# Patient Record
Sex: Female | Born: 1995
Health system: Southern US, Community
[De-identification: ages and names within clinical notes are randomized; demographics above are authoritative.]

## PROBLEM LIST (undated history)

## (undated) DIAGNOSIS — F419 Anxiety disorder, unspecified: Secondary | ICD-10-CM

## (undated) DIAGNOSIS — K589 Irritable bowel syndrome without diarrhea: Secondary | ICD-10-CM

## (undated) DIAGNOSIS — M314 Aortic arch syndrome [Takayasu]: Secondary | ICD-10-CM

## (undated) HISTORY — DX: Aortic arch syndrome (takayasu): M31.4

## (undated) HISTORY — DX: Anxiety disorder, unspecified: F41.9

---
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
## 2017-02-02 DIAGNOSIS — M314 Aortic arch syndrome [Takayasu]: Secondary | ICD-10-CM | POA: Diagnosis not present

## 2017-04-21 DIAGNOSIS — M314 Aortic arch syndrome [Takayasu]: Secondary | ICD-10-CM | POA: Diagnosis not present

## 2017-04-21 DIAGNOSIS — K5900 Constipation, unspecified: Secondary | ICD-10-CM | POA: Diagnosis not present

## 2017-04-21 DIAGNOSIS — M545 Low back pain: Secondary | ICD-10-CM | POA: Diagnosis not present

## 2017-04-21 DIAGNOSIS — R109 Unspecified abdominal pain: Secondary | ICD-10-CM | POA: Diagnosis not present

## 2017-04-21 DIAGNOSIS — M549 Dorsalgia, unspecified: Secondary | ICD-10-CM | POA: Diagnosis not present

## 2017-04-21 DIAGNOSIS — Z0389 Encounter for observation for other suspected diseases and conditions ruled out: Secondary | ICD-10-CM | POA: Diagnosis not present

## 2017-06-10 DIAGNOSIS — Z3041 Encounter for surveillance of contraceptive pills: Secondary | ICD-10-CM | POA: Diagnosis not present

## 2017-07-28 DIAGNOSIS — S5292XA Unspecified fracture of left forearm, initial encounter for closed fracture: Secondary | ICD-10-CM | POA: Diagnosis not present

## 2017-07-30 DIAGNOSIS — S52552A Other extraarticular fracture of lower end of left radius, initial encounter for closed fracture: Secondary | ICD-10-CM | POA: Diagnosis not present

## 2017-07-30 DIAGNOSIS — S63502A Unspecified sprain of left wrist, initial encounter: Secondary | ICD-10-CM | POA: Diagnosis not present

## 2017-07-30 DIAGNOSIS — M25532 Pain in left wrist: Secondary | ICD-10-CM | POA: Diagnosis not present

## 2017-07-30 DIAGNOSIS — S52532A Colles' fracture of left radius, initial encounter for closed fracture: Secondary | ICD-10-CM | POA: Diagnosis not present

## 2017-08-27 DIAGNOSIS — M25532 Pain in left wrist: Secondary | ICD-10-CM | POA: Diagnosis not present

## 2017-09-29 ENCOUNTER — Encounter (HOSPITAL_COMMUNITY): Payer: Self-pay | Admitting: Emergency Medicine

## 2017-09-29 ENCOUNTER — Emergency Department (HOSPITAL_COMMUNITY)
Admission: EM | Admit: 2017-09-29 | Discharge: 2017-09-29 | Disposition: A | Discharge status: Home / Self Care | Payer: BLUE CROSS/BLUE SHIELD | Attending: Emergency Medicine | Admitting: Emergency Medicine

## 2017-09-29 ENCOUNTER — Emergency Department (HOSPITAL_COMMUNITY): Payer: BLUE CROSS/BLUE SHIELD

## 2017-09-29 DIAGNOSIS — M62830 Muscle spasm of back: Secondary | ICD-10-CM | POA: Insufficient documentation

## 2017-09-29 DIAGNOSIS — R109 Unspecified abdominal pain: Secondary | ICD-10-CM | POA: Diagnosis not present

## 2017-09-29 DIAGNOSIS — M549 Dorsalgia, unspecified: Secondary | ICD-10-CM | POA: Diagnosis not present

## 2017-09-29 DIAGNOSIS — R1031 Right lower quadrant pain: Secondary | ICD-10-CM | POA: Diagnosis present

## 2017-09-29 LAB — COMPREHENSIVE METABOLIC PANEL
ALK PHOS: 49 U/L (ref 38–126)
ALT: 20 U/L (ref 14–54)
ANION GAP: 10 (ref 5–15)
AST: 24 U/L (ref 15–41)
Albumin: 4.4 g/dL (ref 3.5–5.0)
BUN: 11 mg/dL (ref 6–20)
CHLORIDE: 108 mmol/L (ref 101–111)
CO2: 22 mmol/L (ref 22–32)
CREATININE: 0.77 mg/dL (ref 0.44–1.00)
Calcium: 9.3 mg/dL (ref 8.9–10.3)
Glucose, Bld: 97 mg/dL (ref 65–99)
POTASSIUM: 3.8 mmol/L (ref 3.5–5.1)
Sodium: 140 mmol/L (ref 135–145)
Total Bilirubin: 0.4 mg/dL (ref 0.3–1.2)
Total Protein: 7.5 g/dL (ref 6.5–8.1)

## 2017-09-29 LAB — CBC WITH DIFFERENTIAL/PLATELET
Basophils Absolute: 0 10*3/uL (ref 0.0–0.1)
Basophils Relative: 0 %
Eosinophils Absolute: 0.1 10*3/uL (ref 0.0–0.7)
Eosinophils Relative: 1 %
HEMATOCRIT: 41.2 % (ref 36.0–46.0)
HEMOGLOBIN: 14 g/dL (ref 12.0–15.0)
LYMPHS ABS: 2.3 10*3/uL (ref 0.7–4.0)
LYMPHS PCT: 29 %
MCH: 30 pg (ref 26.0–34.0)
MCHC: 34 g/dL (ref 30.0–36.0)
MCV: 88.2 fL (ref 78.0–100.0)
MONO ABS: 0.3 10*3/uL (ref 0.1–1.0)
MONOS PCT: 4 %
NEUTROS ABS: 5 10*3/uL (ref 1.7–7.7)
Neutrophils Relative %: 66 %
Platelets: 308 10*3/uL (ref 150–400)
RBC: 4.67 MIL/uL (ref 3.87–5.11)
RDW: 12.6 % (ref 11.5–15.5)
WBC: 7.8 10*3/uL (ref 4.0–10.5)

## 2017-09-29 LAB — POC URINE PREG, ED: Preg Test, Ur: NEGATIVE

## 2017-09-29 LAB — URINALYSIS, ROUTINE W REFLEX MICROSCOPIC
Bilirubin Urine: NEGATIVE
GLUCOSE, UA: NEGATIVE mg/dL
HGB URINE DIPSTICK: NEGATIVE
Ketones, ur: NEGATIVE mg/dL
Leukocytes, UA: NEGATIVE
Nitrite: NEGATIVE
PH: 7 (ref 5.0–8.0)
PROTEIN: NEGATIVE mg/dL
SPECIFIC GRAVITY, URINE: 1.005 (ref 1.005–1.030)

## 2017-09-29 LAB — C-REACTIVE PROTEIN

## 2017-09-29 LAB — SEDIMENTATION RATE: SED RATE: 3 mm/h (ref 0–22)

## 2017-09-29 MED ORDER — CYCLOBENZAPRINE HCL 10 MG PO TABS: 10.0000 mg | ORAL_TABLET | Freq: Two times a day (BID) | ORAL | 0 refills | Status: DC | PRN

## 2017-09-29 MED ORDER — ONDANSETRON 4 MG PO TBDP
4.0000 mg | ORAL_TABLET | Freq: Once | ORAL | Status: AC
  Administered 2017-09-29: 4 mg via ORAL
  Filled 2017-09-29: qty 1

## 2017-09-29 MED ORDER — HYDROMORPHONE HCL 1 MG/ML IJ SOLN
1.0000 mg | Freq: Once | INTRAMUSCULAR | Status: AC
  Administered 2017-09-29: 1 mg via INTRAVENOUS
  Filled 2017-09-29: qty 1

## 2017-09-29 MED ORDER — DICLOFENAC SODIUM 50 MG PO TBEC: 50.0000 mg | DELAYED_RELEASE_TABLET | Freq: Two times a day (BID) | ORAL | 0 refills | Status: DC

## 2017-09-29 MED ORDER — CYCLOBENZAPRINE HCL 10 MG PO TABS
10.0000 mg | ORAL_TABLET | Freq: Once | ORAL | Status: AC
  Administered 2017-09-29: 10 mg via ORAL
  Filled 2017-09-29: qty 1

## 2017-09-29 MED ORDER — PREDNISONE 20 MG PO TABS
60.0000 mg | ORAL_TABLET | Freq: Once | ORAL | Status: AC
  Administered 2017-09-29: 60 mg via ORAL
  Filled 2017-09-29: qty 3

## 2017-09-29 MED ORDER — HYDROCODONE-ACETAMINOPHEN 5-325 MG PO TABS
1.0000 | ORAL_TABLET | Freq: Once | ORAL | Status: AC
  Administered 2017-09-29: 1 via ORAL
  Filled 2017-09-29: qty 1

## 2017-09-29 MED ORDER — IOPAMIDOL (ISOVUE-370) INJECTION 76%
INTRAVENOUS | Status: AC
  Administered 2017-09-29: 80 mL
  Filled 2017-09-29: qty 100

## 2017-09-29 MED ORDER — SODIUM CHLORIDE 0.9 % IV BOLUS (SEPSIS)
1000.0000 mL | Freq: Once | INTRAVENOUS | Status: AC
  Administered 2017-09-29: 1000 mL via INTRAVENOUS

## 2017-09-29 NOTE — ED Notes (Signed)
Patient transported to CT 

## 2017-09-29 NOTE — ED Notes (Signed)
Patient states no relief from meds-patient texting on phone

## 2017-09-29 NOTE — ED Provider Notes (Signed)
[facility]   CSN: [id number] Arrival date & time: 09/29/17  1621     History   Chief Complaint Chief Complaint  Patient presents with  . Back Pain    HPI Kelsey Myers is a 21 y.o. female who presents to the ED with back pain. The pain is located on the lower right side and radiates to the right side of abdomen. Patient reports the pain started 5 days ago and has gotten worse. She reports that she is in remission for  Takayasu's and has episodes of back pain that doctors think may be related. The pain reports having similar back pain to what it is today and that she usually gets an IV and is given medication through the IV.   The history is provided by the patient. No language interpreter was used.  Back Pain   The current episode started more than 2 days ago. The problem occurs constantly. The problem has been gradually worsening. The pain is associated with no known injury. The pain is present in the lumbar spine. The pain is at a severity of 8/10. The symptoms are aggravated by bending and twisting. The pain is the same all the time. Associated symptoms include a fever (yesterday 100), headaches and abdominal pain. Pertinent negatives include no chest pain, no bowel incontinence, no bladder incontinence, no dysuria and no pelvic pain. She has tried NSAIDs for the symptoms. The treatment provided no relief.    Past Medical History:  Diagnosis Date  . Takayasu's arteritis (HCC)     There are no active problems to display for this patient.   History reviewed. No pertinent surgical history.  OB History    No data available       Home Medications    Prior to Admission medications   Medication Sig Start Date End Date Taking? Authorizing Provider  JUNEL FE 1/20 1-20 MG-MCG tablet TK 1 T PO QD. PATIENT NEEDS TO SCHEDULE ANNUAL APPOINTMENT PRIOR TO FURTHER REFILLS. [facility] 08/09/17  Yes [provider]  azithromycin (ZITHROMAX Z-PAK) 250 MG tablet Take as directed on pack Patient not taking: Reported on 09/29/2017 08/29/16   Linna HoffKindl, James D, MD  cyclobenzaprine (FLEXERIL) 10 MG tablet Take 1 tablet (10 mg total) by mouth 2 (two) times daily as needed for muscle spasms. 09/29/17   Janne NapoleonNeese, Eniyah Eastmond M, NP  diclofenac (VOLTAREN) 50 MG EC tablet Take 1 tablet (50 mg total) by mouth 2 (two) times daily. 09/29/17   Janne NapoleonNeese, Gwen Edler M, NP  ipratropium (ATROVENT) 0.06 % nasal spray Place 2 sprays into both nostrils 4 (four) times daily. Patient not taking: Reported on 09/29/2017 08/29/16   Linna HoffKindl, James D, MD    Family History No family history on file.  Social History Social History  Substance Use Topics  . Smoking status: Never Smoker  . Smokeless tobacco: Never Used  . Alcohol use No     Allergies   Patient has no known allergies.   Review of Systems Review of Systems  Constitutional: Positive for fever (yesterday 100). Negative for chills.  HENT: Negative.   Eyes: Negative for visual disturbance.  Respiratory: Negative for chest tightness and shortness of breath.   Cardiovascular: Negative for chest pain.  Gastrointestinal: Positive for abdominal pain and nausea. Negative for bowel incontinence and vomiting.  Genitourinary: Negative for bladder incontinence, difficulty urinating, dysuria, frequency, pelvic pain, vaginal bleeding and vaginal discharge.  Musculoskeletal: Positive for back pain. Negative for  neck pain and neck stiffness.  Skin: Negative for rash and wound.  Neurological: Positive for light-headedness and headaches. Negative for syncope.  Psychiatric/Behavioral: Negative for confusion. The patient is not nervous/anxious.      Physical Exam Updated Vital Signs BP 123/73 (BP Location: Right Arm)   Pulse 65   Temp 98.2 F (36.8 C) (Oral)   Resp 14   LMP 09/15/2017   SpO2 100%   Physical Exam  Constitutional: She is oriented to person, place, and time. She  appears well-developed and well-nourished. No distress.  HENT:  Head: Normocephalic and atraumatic.  Right Ear: Tympanic membrane normal.  Left Ear: Tympanic membrane normal.  Nose: Nose normal.  Mouth/Throat: Uvula is midline, oropharynx is clear and moist and mucous membranes are normal.  Eyes: EOM are normal.  Neck: Normal range of motion. Neck supple.  Cardiovascular: Normal rate and regular rhythm.   Pulmonary/Chest: Effort normal. She has no wheezes. She has no rales.  Abdominal: Soft. Bowel sounds are normal. There is no tenderness.  Musculoskeletal:       Lumbar back: She exhibits tenderness, pain and spasm. She exhibits normal pulse.  Radial pulses 2+, grips equal  Neurological: She is alert and oriented to person, place, and time. She has normal strength. Abnormal gait: due to pain, no foot drag.  Reflex Scores:      Bicep reflexes are 2+ on the right side and 2+ on the left side.      Brachioradialis reflexes are 2+ on the right side and 2+ on the left side.      Patellar reflexes are 2+ on the right side and 2+ on the left side. Skin: Skin is warm and dry.  Psychiatric: She has a normal mood and affect. Her behavior is normal.  Nursing note and vitals reviewed.    ED Treatments / Results  Labs (all labs ordered are listed, but only abnormal results are displayed) Labs Reviewed  URINALYSIS, ROUTINE W REFLEX MICROSCOPIC - Abnormal; Notable for the following:       Result Value   Color, Urine STRAW (*)    All other components within normal limits  CBC WITH DIFFERENTIAL/PLATELET  COMPREHENSIVE METABOLIC PANEL  SEDIMENTATION RATE  C-REACTIVE PROTEIN  POC URINE PREG, ED    Radiology Ct Angio Chest/abd/pel For Dissection W And/or Wo Contrast  Result Date: 09/29/2017 CLINICAL DATA:  Right flank and back pain. History of Takayasu arteritis. EXAM: CT ANGIOGRAPHY CHEST, ABDOMEN AND PELVIS TECHNIQUE: Multidetector CT imaging through the chest, abdomen and pelvis was  performed using the standard protocol during bolus administration of intravenous contrast. Multiplanar reconstructed images and MIPs were obtained and reviewed to evaluate the vascular anatomy. The patient movement during bolus timing leading to suboptimal contrast bolus timing. CONTRAST:  80 cc Isovue 370 IV COMPARISON:  No prior exams available for comparison. Report from chest MRI 03/01/2014 at an outside institution reviewed FINDINGS: CTA CHEST FINDINGS Cardiovascular: No aortic aneurysm, stenosis or hematoma. Majority of IV contrast is within the pulmonary arteries, however exam is diagnostic to rule out aortic dissection. There is faint aortic wall thickening of the descending aorta. Normal heart size. No pulmonary embolus. Minimal pericardial fluid is physiologic. Mediastinum/Nodes: Minimal soft tissue density in the anterior mediastinum consistent with residual thymus, normal for age. No adenopathy. The esophagus is decompressed. Visualized thyroid gland is normal. Lungs/Pleura: Clear lungs. No consolidation, pleural fluid or pulmonary edema. Musculoskeletal: Mild broad-based scoliotic curvature of the lower thoracic and lumbar spine. There are no acute  or suspicious osseous abnormalities. Review of the MIP images confirms the above findings. CTA ABDOMEN AND PELVIS FINDINGS VASCULAR Aorta: Normal caliber aorta without aneurysm, dissection, vasculitis or significant stenosis. No significant luminal regularity. Celiac: Patent without evidence of aneurysm, dissection, vasculitis or significant stenosis. SMA: Patent without evidence of aneurysm, dissection, vasculitis or significant stenosis. Renals: 2 right renal arteries with more superior artery supplying approximately 2/3 and more inferior artery supplying 1/3 of the kidney. Single left renal artery. All renal arteries are patent without evidence of aneurysm, dissection, vasculitis, fibromuscular dysplasia or significant stenosis. IMA: Patent without  evidence of aneurysm, dissection, vasculitis or significant stenosis. Inflow: Patent without evidence of aneurysm, dissection, vasculitis or significant stenosis. Veins: No obvious venous abnormality within the limitations of this arterial phase study. Review of the MIP images confirms the above findings. NON-VASCULAR Hepatobiliary: No focal liver abnormality is seen. No gallstones, gallbladder wall thickening, or biliary dilatation. Pancreas: No ductal dilatation or inflammation. Spleen: Normal arterial phase imaging.  Normal in size. Adrenals/Urinary Tract: Normal adrenal glands. Homogeneous arterial enhancement. No hydronephrosis or perinephric edema. Urinary bladder physiologically distended, no wall thickening. Stomach/Bowel: Stomach is within normal limits. Appendix appears normal. No evidence of bowel wall thickening, distention, or inflammatory changes. Moderate colonic stool burden without colonic inflammation. Tortuous transverse colon. Lymphatic: Scattered prominent ileocolic nodes, largest measuring 8 mm in short axis. No retroperitoneal or pelvic adenopathy. Reproductive: Uterus and bilateral adnexa are unremarkable. Small amount of free fluid in the pelvis is physiologic. Other: No free air, ascites or intra-abdominal abscess. Musculoskeletal: There are no acute or suspicious osseous abnormalities. Review of the MIP images confirms the above findings. IMPRESSION: 1. No acute aortic or vascular abnormality. Trace descending thoracic aortic wall thickening, was described on prior outside chest MRI, likely sequela of prior vasculitis. 2. Scattered prominent ileocolic nodes be reactive or represent mesenteric adenitis. 3. There is otherwise no acute abnormality of the chest, abdomen, or pelvis. Electronically Signed   By: Rubye Oaks M.D.   On: 09/29/2017 22:10    Procedures Procedures (including critical care time)  Medications Ordered in ED Medications  cyclobenzaprine (FLEXERIL) tablet 10  mg (10 mg Oral Given 09/29/17 1734)  HYDROcodone-acetaminophen (NORCO/VICODIN) 5-325 MG per tablet 1 tablet (1 tablet Oral Given 09/29/17 1734)  ondansetron (ZOFRAN-ODT) disintegrating tablet 4 mg (4 mg Oral Given 09/29/17 1734)  predniSONE (DELTASONE) tablet 60 mg (60 mg Oral Given 09/29/17 2001)  sodium chloride 0.9 % bolus 1,000 mL (0 mLs Intravenous Stopped 09/29/17 2144)  HYDROmorphone (DILAUDID) injection 1 mg (1 mg Intravenous Given 09/29/17 2019)  HYDROmorphone (DILAUDID) injection 1 mg (1 mg Intravenous Given 09/29/17 2143)  iopamidol (ISOVUE-370) 76 % injection (80 mLs  Contrast Given 09/29/17 2116)     Initial Impression / Assessment and Plan / ED Course  I have reviewed the triage vital signs and the nursing notes.  Patient with back pain.  No neurological deficits and normal neuro exam.  Patient can walk but states is painful.  No loss of bowel or bladder control.  No concern for cauda equina.  No fever, night sweats, weight loss, h/o cancer, IVDU.  RICE protocol and pain medicine indicated and discussed with patient.  Patient labs and scan reviewed and Dr. Silverio Lay evaluated the patient due to the amount of pain and patient's hx of Takayasu's.  No change in CT from previous done at an outside facility and normal labs.   Final Clinical Impressions(s) / ED Diagnoses   Final diagnoses:  Muscle spasm of  back    New Prescriptions New Prescriptions   CYCLOBENZAPRINE (FLEXERIL) 10 MG TABLET    Take 1 tablet (10 mg total) by mouth 2 (two) times daily as needed for muscle spasms.   DICLOFENAC (VOLTAREN) 50 MG EC TABLET    Take 1 tablet (50 mg total) by mouth 2 (two) times daily.     Kerrie Buffalo [place], [place] 09/29/17 2324    Charlynne Pander, MD 10/01/17 [phone]

## 2017-09-29 NOTE — ED Triage Notes (Signed)
Per pt, states back pain for a couple of weeks-no dysuria-states she has chronic condition and she does not know if that is what is causing her pain

## 2017-09-29 NOTE — Discharge Instructions (Signed)
The medication can make you sleepy so do not drive while taking it. Call your doctor tomorrow for follow up.

## 2017-10-01 ENCOUNTER — Encounter (HOSPITAL_COMMUNITY): Payer: Self-pay | Admitting: Emergency Medicine

## 2017-10-01 ENCOUNTER — Emergency Department (HOSPITAL_COMMUNITY): Payer: BLUE CROSS/BLUE SHIELD

## 2017-10-01 ENCOUNTER — Emergency Department (HOSPITAL_COMMUNITY)
Admission: EM | Admit: 2017-10-01 | Discharge: 2017-10-01 | Disposition: A | Discharge status: Home / Self Care | Payer: BLUE CROSS/BLUE SHIELD | Attending: Emergency Medicine | Admitting: Emergency Medicine

## 2017-10-01 DIAGNOSIS — R5383 Other fatigue: Secondary | ICD-10-CM | POA: Insufficient documentation

## 2017-10-01 DIAGNOSIS — R110 Nausea: Secondary | ICD-10-CM | POA: Diagnosis not present

## 2017-10-01 DIAGNOSIS — R109 Unspecified abdominal pain: Secondary | ICD-10-CM

## 2017-10-01 DIAGNOSIS — Z79899 Other long term (current) drug therapy: Secondary | ICD-10-CM | POA: Insufficient documentation

## 2017-10-01 DIAGNOSIS — R102 Pelvic and perineal pain: Secondary | ICD-10-CM | POA: Diagnosis not present

## 2017-10-01 DIAGNOSIS — Z8679 Personal history of other diseases of the circulatory system: Secondary | ICD-10-CM | POA: Insufficient documentation

## 2017-10-01 DIAGNOSIS — R1011 Right upper quadrant pain: Secondary | ICD-10-CM | POA: Insufficient documentation

## 2017-10-01 LAB — CBC
HEMATOCRIT: 41.3 % (ref 36.0–46.0)
Hemoglobin: 14.3 g/dL (ref 12.0–15.0)
MCH: 30.6 pg (ref 26.0–34.0)
MCHC: 34.6 g/dL (ref 30.0–36.0)
MCV: 88.2 fL (ref 78.0–100.0)
PLATELETS: 330 10*3/uL (ref 150–400)
RBC: 4.68 MIL/uL (ref 3.87–5.11)
RDW: 12.5 % (ref 11.5–15.5)
WBC: 7.1 10*3/uL (ref 4.0–10.5)

## 2017-10-01 LAB — URINALYSIS, ROUTINE W REFLEX MICROSCOPIC
Bilirubin Urine: NEGATIVE
GLUCOSE, UA: NEGATIVE mg/dL
Hgb urine dipstick: NEGATIVE
Ketones, ur: NEGATIVE mg/dL
Nitrite: NEGATIVE
PH: 7 (ref 5.0–8.0)
Protein, ur: NEGATIVE mg/dL
SPECIFIC GRAVITY, URINE: 1.006 (ref 1.005–1.030)

## 2017-10-01 LAB — COMPREHENSIVE METABOLIC PANEL
ALBUMIN: 4.4 g/dL (ref 3.5–5.0)
ALT: 20 U/L (ref 14–54)
AST: 25 U/L (ref 15–41)
Alkaline Phosphatase: 52 U/L (ref 38–126)
Anion gap: 8 (ref 5–15)
BILIRUBIN TOTAL: 0.3 mg/dL (ref 0.3–1.2)
BUN: 10 mg/dL (ref 6–20)
CHLORIDE: 109 mmol/L (ref 101–111)
CO2: 21 mmol/L — ABNORMAL LOW (ref 22–32)
CREATININE: 0.91 mg/dL (ref 0.44–1.00)
Calcium: 9.4 mg/dL (ref 8.9–10.3)
GFR calc Af Amer: >60 mL/min (ref >60)
GLUCOSE: 96 mg/dL (ref 65–99)
POTASSIUM: 3.8 mmol/L (ref 3.5–5.1)
Sodium: 138 mmol/L (ref 135–145)
TOTAL PROTEIN: 7.8 g/dL (ref 6.5–8.1)

## 2017-10-01 LAB — LIPASE, BLOOD: Lipase: 30 U/L (ref 11–51)

## 2017-10-01 LAB — I-STAT BETA HCG BLOOD, ED (MC, WL, AP ONLY): I-stat hCG, quantitative: <5 m[IU]/mL (ref <5)

## 2017-10-01 MED ORDER — ONDANSETRON HCL 4 MG/2ML IJ SOLN
4.0000 mg | Freq: Once | INTRAMUSCULAR | Status: AC
  Administered 2017-10-01: 4 mg via INTRAVENOUS
  Filled 2017-10-01: qty 2

## 2017-10-01 MED ORDER — MORPHINE SULFATE (PF) 4 MG/ML IV SOLN
4.0000 mg | Freq: Once | INTRAVENOUS | Status: AC
  Administered 2017-10-01: 4 mg via INTRAVENOUS
  Filled 2017-10-01: qty 1

## 2017-10-01 MED ORDER — OMEPRAZOLE 20 MG PO CPDR: 20.0000 mg | DELAYED_RELEASE_CAPSULE | Freq: Every day | ORAL | 0 refills | Status: DC

## 2017-10-01 MED ORDER — METOCLOPRAMIDE HCL 5 MG/ML IJ SOLN
10.0000 mg | Freq: Once | INTRAMUSCULAR | Status: AC
  Administered 2017-10-01: 10 mg via INTRAVENOUS
  Filled 2017-10-01: qty 2

## 2017-10-01 MED ORDER — SODIUM CHLORIDE 0.9 % IV BOLUS (SEPSIS)
1000.0000 mL | Freq: Once | INTRAVENOUS | Status: AC
Start: 2017-10-01 — End: 2017-10-01
  Administered 2017-10-01: 1000 mL via INTRAVENOUS

## 2017-10-01 MED ORDER — MORPHINE SULFATE (PF) 4 MG/ML IV SOLN
4.0000 mg | Freq: Once | INTRAVENOUS | Status: AC
Start: 2017-10-01 — End: 2017-10-01
  Administered 2017-10-01: 4 mg via INTRAVENOUS
  Filled 2017-10-01: qty 1

## 2017-10-01 MED ORDER — ONDANSETRON 4 MG PO TBDP: 4.0000 mg | ORAL_TABLET | Freq: Three times a day (TID) | ORAL | 0 refills | Status: DC | PRN

## 2017-10-01 MED ORDER — DIPHENHYDRAMINE HCL 50 MG/ML IJ SOLN
25.0000 mg | Freq: Once | INTRAMUSCULAR | Status: AC
  Administered 2017-10-01: 25 mg via INTRAVENOUS
  Filled 2017-10-01: qty 1

## 2017-10-01 MED ORDER — KETOROLAC TROMETHAMINE 15 MG/ML IJ SOLN
15.0000 mg | Freq: Once | INTRAMUSCULAR | Status: AC
  Administered 2017-10-01: 15 mg via INTRAVENOUS
  Filled 2017-10-01: qty 1

## 2017-10-01 MED ORDER — HYDROMORPHONE HCL 1 MG/ML IJ SOLN
0.5000 mg | Freq: Once | INTRAMUSCULAR | Status: AC
  Administered 2017-10-01: 0.5 mg via INTRAVENOUS
  Filled 2017-10-01: qty 1

## 2017-10-01 MED ORDER — OXYCODONE-ACETAMINOPHEN 5-325 MG PO TABS: 1.0000 | ORAL_TABLET | ORAL | 0 refills | Status: DC | PRN

## 2017-10-01 MED ORDER — HYDROCODONE-ACETAMINOPHEN 5-325 MG PO TABS
2.0000 | ORAL_TABLET | Freq: Once | ORAL | Status: AC
  Administered 2017-10-01: 2 via ORAL
  Filled 2017-10-01: qty 2

## 2017-10-01 NOTE — ED Notes (Signed)
Pt aware urine sample is needed, pt is having ultrasound first and needs to have full bladder for procedure.

## 2017-10-01 NOTE — ED Provider Notes (Signed)
[facility]-EMERGENCY DEPT Provider Note   CSN: [id number] Arrival date & time: 10/01/17  1300     History   Chief Complaint Chief Complaint  Patient presents with  . Flank Pain  . Abdominal Pain    HPI Kelsey Myers is a 21 y.o. female.  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  The patient has a reported history of extensive previous abdominal and flank pain.  She has seen pain clinics for this in the past.  She was recently seen for this and had a negative CT angiogram of the abdomen and pelvis as well as chest.  It showed no evidence of recurrent arteritis.  She reports the pain is aching, throbbing, crampy like, throughout the right flank and right lower quadrant.  It is worse with movement and palpation.  She denies any alleviating factors.  She denies any vaginal bleeding or discharge.  She is not sexually active and has never been sexually active.  Denies any urinary frequency or urgency.  Denies any dysuria.  Pain is similar to her chronic pain, although acutely worsened.  The pain does seem to come and go approximately once a month.  It does also worsen with eating.  Past Medical History:  Diagnosis Date  . Takayasu's arteritis (HCC)     There are no active problems to display for this patient.   History reviewed. No pertinent surgical history.  OB History    No data available       Home Medications    Prior to Admission medications   Medication Sig Start Date End Date Taking? Authorizing Provider  cyclobenzaprine (FLEXERIL) 10 MG tablet Take 1 tablet (10 mg total) by mouth 2 (two) times daily as needed for muscle spasms. 09/29/17  Yes Neese, Hope M, NP  diclofenac (VOLTAREN) 50 MG EC tablet Take 1 tablet (50 mg total) by mouth 2 (two) times daily. 09/29/17  Yes Neese, Hope M, NP  ibuprofen (ADVIL,MOTRIN) 200 MG tablet Take 600 mg by mouth daily as needed for moderate pain.     Yes [provider]  JUNEL FE 1/20 1-20 MG-MCG tablet TK 1 T PO QD. PATIENT NEEDS TO SCHEDULE ANNUAL APPOINTMENT PRIOR TO FURTHER REFILLS. GENERIC SUBSTITUTE FOR MICROGESTIN FE. 08/09/17  Yes [provider]  azithromycin (ZITHROMAX Z-PAK) 250 MG tablet Take as directed on pack Patient not taking: Reported on 09/29/2017 08/29/16   Linna HoffKindl, James D, MD  ipratropium (ATROVENT) 0.06 % nasal spray Place 2 sprays into both nostrils 4 (four) times daily. Patient not taking: Reported on 09/29/2017 08/29/16   Linna HoffKindl, James D, MD  omeprazole (PRILOSEC) 20 MG capsule Take 1 capsule (20 mg total) by mouth daily. 10/01/17   Shaune PollackIsaacs, Dryden Tapley, MD  ondansetron (ZOFRAN ODT) 4 MG disintegrating tablet Take 1 tablet (4 mg total) by mouth every 8 (eight) hours as needed for nausea or vomiting. 10/01/17   Shaune PollackIsaacs, Asiyah Pineau, MD  oxyCODONE-acetaminophen (PERCOCET/ROXICET) 5-325 MG tablet Take 1-2 tablets by mouth every 4 (four) hours as needed for severe pain. 10/01/17   Shaune PollackIsaacs, Kashten Gowin, MD    Family History No family history on file.  Social History Social History  Substance Use Topics  . Smoking status: Never Smoker  . Smokeless tobacco: Never Used  . Alcohol use No     Allergies   Patient has no known allergies.   Review of Systems Review of Systems  Constitutional: Positive for fatigue.  Gastrointestinal: Positive  for abdominal pain and nausea.  Genitourinary: Positive for flank pain.  All other systems reviewed and are negative.    Physical Exam Updated Vital Signs BP 113/76   Pulse 76   Temp 98.2 F (36.8 C)   Resp 18   Ht 5' (1.524 m)   Wt 49.9 kg (110 lb)   LMP 09/15/2017   SpO2 96%   BMI 21.48 kg/m   Physical Exam  Constitutional: She is oriented to person, place, and time. She appears well-developed and well-nourished. No distress.  HENT:  Head: Normocephalic and atraumatic.  Eyes: Conjunctivae are normal.  Neck: Neck supple.  Cardiovascular: Normal rate, regular  rhythm and normal heart sounds.  Exam reveals no friction rub.   No murmur heard. Pulmonary/Chest: Effort normal and breath sounds normal. No respiratory distress. She has no wheezes. She has no rales.  Abdominal: She exhibits no distension.  Musculoskeletal: She exhibits no edema.  Neurological: She is alert and oriented to person, place, and time. She exhibits normal muscle tone.  Skin: Skin is warm. Capillary refill takes less than 2 seconds.  Psychiatric: She has a normal mood and affect.  Nursing note and vitals reviewed.    ED Treatments / Results  Labs (all labs ordered are listed, but only abnormal results are displayed) Labs Reviewed  COMPREHENSIVE METABOLIC PANEL - Abnormal; Notable for the following:       Result Value   CO2 21 (*)    All other components within normal limits  URINALYSIS, ROUTINE W REFLEX MICROSCOPIC - Abnormal; Notable for the following:    Color, Urine STRAW (*)    Leukocytes, UA TRACE (*)    Bacteria, UA RARE (*)    Squamous Epithelial / LPF 0-5 (*)    All other components within normal limits  LIPASE, BLOOD  CBC  I-STAT BETA HCG BLOOD, ED ([facility], [facility], [facility] ONLY)    EKG  EKG Interpretation None       Radiology US Pelvis Complete  Result Date: 10/01/2017 CLINICAL DATA:  Patient with pelvic pain. EXAM: TRANSABDOMINAL ULTRASOUND OF PELVIS DOPPLER ULTRASOUND OF OVARIES TECHNIQUE: Transabdominal ultrasound examination of the pelvis was performed including evaluation of the uterus, ovaries, adnexal regions, and pelvic cul-de-sac. Color and duplex Doppler ultrasound was utilized to evaluate blood flow to the ovaries. COMPARISON:  None. FINDINGS: Limited exam given TA technique. Uterus Measurements: 5.4 x 2.3 x 3.7 cm. No fibroids or other mass visualized. Endometrium Thickness: 8 mm. No focal abnormality visualized. Right ovary Measurements: 3.1 x 1.9 x 2.3 cm. Normal appearance/no adnexal mass. Left ovary Measurements: 2.8 x 1.2 x 1.6 cm. Normal  appearance/no adnexal mass. Pulsed Doppler evaluation demonstrates normal low-resistance arterial and venous waveforms in both ovaries. IMPRESSION: Limited exam. No acute process given limited TA technique. Electronically Signed   By: Annia Belt M.D.   On: 10/01/2017 19:24   [place] Art/ven Flow Abd Pelv Doppler  Result Date: 10/01/2017 CLINICAL DATA:  Patient with pelvic pain. EXAM: TRANSABDOMINAL ULTRASOUND OF PELVIS DOPPLER ULTRASOUND OF OVARIES TECHNIQUE: Transabdominal ultrasound examination of the pelvis was performed including evaluation of the uterus, ovaries, adnexal regions, and pelvic cul-de-sac. Color and duplex Doppler ultrasound was utilized to evaluate blood flow to the ovaries. COMPARISON:  None. FINDINGS: Limited exam given TA technique. Uterus Measurements: 5.4 x 2.3 x 3.7 cm. No fibroids or other mass visualized. Endometrium Thickness: 8 mm. No focal abnormality visualized. Right ovary Measurements: 3.1 x 1.9 x 2.3 cm. Normal appearance/no adnexal mass. Left ovary Measurements: 2.8  x 1.2 x 1.6 cm. Normal appearance/no adnexal mass. Pulsed Doppler evaluation demonstrates normal low-resistance arterial and venous waveforms in both ovaries. IMPRESSION: Limited exam. No acute process given limited TA technique. Electronically Signed   By: Annia Belt M.D.   On: 10/01/2017 19:24   US Abdomen Limited Ruq  Result Date: 10/01/2017 CLINICAL DATA:  Patient right upper quadrant abdominal pain. EXAM: ULTRASOUND ABDOMEN LIMITED RIGHT UPPER QUADRANT COMPARISON:  CT 09/29/2017. FINDINGS: Gallbladder: No gallstones or wall thickening visualized. No sonographic Murphy sign noted by sonographer. Common bile duct: Diameter: 2 mm Liver: No focal lesion identified. Within normal limits in parenchymal echogenicity. Portal vein is patent on color Doppler imaging with normal direction of blood flow towards the liver. IMPRESSION: No cholelithiasis or sonographic evidence for acute cholecystitis. Electronically Signed    By: Annia Belt M.D.   On: 10/01/2017 19:29    Procedures Procedures (including critical care time)  Medications Ordered in ED Medications  morphine 4 MG/ML injection 4 mg (4 mg Intravenous Given 10/01/17 1751)  ondansetron (ZOFRAN) injection 4 mg (4 mg Intravenous Given 10/01/17 1750)  ketorolac (TORADOL) 15 MG/ML injection 15 mg (15 mg Intravenous Given 10/01/17 1750)  sodium chloride 0.9 % bolus 1,000 mL (0 mLs Intravenous Stopped 10/01/17 2013)  morphine 4 MG/ML injection 4 mg (4 mg Intravenous Given 10/01/17 2043)  HYDROcodone-acetaminophen (NORCO/VICODIN) 5-325 MG per tablet 2 tablet (2 tablets Oral Given 10/01/17 2124)  HYDROmorphone (DILAUDID) injection 0.5 mg (0.5 mg Intravenous Given 10/01/17 2124)  metoCLOPramide (REGLAN) injection 10 mg (10 mg Intravenous Given 10/01/17 2124)  diphenhydrAMINE (BENADRYL) injection 25 mg (25 mg Intravenous Given 10/01/17 2124)     Initial Impression / Assessment and Plan / ED Course  I have reviewed the triage vital signs and the nursing notes.  Pertinent labs & imaging results that were available during my care of the patient were reviewed by me and considered in my medical decision making (see chart for details).     21 yo F with h/o Takayasu arteritis here with acute on chronic R flank pain. Exam as above. Pt was just seen for similar sx and had no signs of aortic or other abnormality on CT C/A/P. Lab work today is again reassuring. She is having persistent, severe pain however - will check RUQ U/S, also pelvic U/S for eval of possible torsion. Must also consider functional etiology, also possibly endometriosis in setting of chronic, monthly, severe pain.   U/S neg for acute abnormality. Sx improving with analgesia here. UA without UTI. She denies sexual activity, doubt PID or STI. Will refer for further eval with GI and OB, encourage return precautions. No apparent acute emergent pathology.  Final Clinical Impressions(s) / ED Diagnoses   Final  diagnoses:  RUQ pain  Flank pain    New Prescriptions Discharge Medication List as of 10/01/2017 10:11 PM    START taking these medications   Details  omeprazole (PRILOSEC) 20 MG capsule Take 1 capsule (20 mg total) by mouth daily., Starting Fri 10/01/2017, Print    ondansetron (ZOFRAN ODT) 4 MG disintegrating tablet Take 1 tablet (4 mg total) by mouth every 8 (eight) hours as needed for nausea or vomiting., Starting Fri 10/01/2017, Print    oxyCODONE-acetaminophen (PERCOCET/ROXICET) 5-325 MG tablet Take 1-2 tablets by mouth every 4 (four) hours as needed for severe pain., Starting Fri 10/01/2017, Print         Shaune Pollack, MD 10/02/17 [phone]

## 2017-10-01 NOTE — ED Triage Notes (Signed)
Patient c/o rigth flank and abd pain since Monday. Patient denies any urinary, n/v/d or constipation issues.

## 2017-10-01 NOTE — ED Notes (Signed)
ED Provider at bedside. 

## 2017-10-01 NOTE — Discharge Instructions (Signed)
I recommend following up with an OBGYN to discuss your symptoms and possible ENDOMETRIOSIS.  You may also benefit from a discussion with a GI doctor about possible ulcers and/or a HIDA scan

## 2017-10-06 ENCOUNTER — Emergency Department (HOSPITAL_COMMUNITY)
Admission: EM | Admit: 2017-10-06 | Discharge: 2017-10-06 | Disposition: A | Discharge status: Home / Self Care | Payer: BLUE CROSS/BLUE SHIELD | Attending: Emergency Medicine | Admitting: Emergency Medicine

## 2017-10-06 ENCOUNTER — Other Ambulatory Visit: Payer: Self-pay

## 2017-10-06 ENCOUNTER — Encounter (HOSPITAL_COMMUNITY): Payer: Self-pay

## 2017-10-06 DIAGNOSIS — R1010 Upper abdominal pain, unspecified: Secondary | ICD-10-CM | POA: Insufficient documentation

## 2017-10-06 DIAGNOSIS — M314 Aortic arch syndrome [Takayasu]: Secondary | ICD-10-CM | POA: Diagnosis not present

## 2017-10-06 DIAGNOSIS — Z79899 Other long term (current) drug therapy: Secondary | ICD-10-CM | POA: Insufficient documentation

## 2017-10-06 DIAGNOSIS — R110 Nausea: Secondary | ICD-10-CM | POA: Diagnosis not present

## 2017-10-06 DIAGNOSIS — R5081 Fever presenting with conditions classified elsewhere: Secondary | ICD-10-CM | POA: Diagnosis not present

## 2017-10-06 DIAGNOSIS — M549 Dorsalgia, unspecified: Secondary | ICD-10-CM | POA: Diagnosis not present

## 2017-10-06 DIAGNOSIS — R100 Acute abdomen: Secondary | ICD-10-CM | POA: Diagnosis not present

## 2017-10-06 DIAGNOSIS — R109 Unspecified abdominal pain: Secondary | ICD-10-CM

## 2017-10-06 DIAGNOSIS — R52 Pain, unspecified: Secondary | ICD-10-CM | POA: Diagnosis not present

## 2017-10-06 MED ORDER — DEXAMETHASONE SODIUM PHOSPHATE 10 MG/ML IJ SOLN
10.0000 mg | Freq: Once | INTRAMUSCULAR | Status: AC
  Administered 2017-10-06: 10 mg via INTRAVENOUS
  Filled 2017-10-06: qty 1

## 2017-10-06 MED ORDER — HALOPERIDOL LACTATE 5 MG/ML IJ SOLN
2.0000 mg | Freq: Once | INTRAMUSCULAR | Status: AC
  Administered 2017-10-06: 2 mg via INTRAVENOUS
  Filled 2017-10-06: qty 1

## 2017-10-06 MED ORDER — MORPHINE SULFATE (PF) 4 MG/ML IV SOLN
6.0000 mg | Freq: Once | INTRAVENOUS | Status: AC
  Administered 2017-10-06: 6 mg via INTRAVENOUS
  Filled 2017-10-06: qty 2

## 2017-10-06 MED ORDER — DICYCLOMINE HCL 10 MG PO CAPS
10.0000 mg | ORAL_CAPSULE | Freq: Once | ORAL | Status: AC
  Administered 2017-10-06: 10 mg via ORAL
  Filled 2017-10-06: qty 1

## 2017-10-06 MED ORDER — SODIUM CHLORIDE 0.9 % IV BOLUS (SEPSIS)
1000.0000 mL | Freq: Once | INTRAVENOUS | Status: AC
  Administered 2017-10-06: 1000 mL via INTRAVENOUS

## 2017-10-06 NOTE — ED Provider Notes (Signed)
[facility]-EMERGENCY DEPT Provider Note   CSN: [id number] Arrival date & time: 10/06/17  1745     History   Chief Complaint Chief Complaint  Patient presents with  . Back Pain  . Flank Pain    HPI Rock NephewSabrina Herrman is a 21 y.o. female.  HPI   21 year old female with abdominal pain.  She has a long-standing history of abdominal pain.  Past history of Takayasu's arteritis.  She had similar type symptoms at that time she does more currently.  Several recent workups which are not consistent with this etiology though.  She had a GI appointment today and was referred to the emergency room.  She reports that the GI provider did not feel that the etiology was her gallbladder or IBS.  She describes the pain is fairly constant since onset.  Sharp. Upper abdomen, worse on R and radiates into flank/back. Mildly improved with pain medications.  No appreciable exacerbating relieving factors.  Past Medical History:  Diagnosis Date  . Takayasu's arteritis (HCC)     There are no active problems to display for this patient.   History reviewed. No pertinent surgical history.  OB History    No data available       Home Medications    Prior to Admission medications   Medication Sig Start Date End Date Taking? Authorizing Provider  JUNEL FE 1/20 1-20 MG-MCG tablet TK 1 T PO QD. PATIENT NEEDS TO SCHEDULE ANNUAL APPOINTMENT PRIOR TO FURTHER REFILLS. GENERIC SUBSTITUTE FOR MICROGESTIN FE. 08/09/17  Yes [provider]  omeprazole (PRILOSEC) 20 MG capsule Take 1 capsule (20 mg total) by mouth daily. 10/01/17  Yes Shaune PollackIsaacs, Cameron, MD  oxyCODONE-acetaminophen (PERCOCET/ROXICET) 5-325 MG tablet Take 1-2 tablets by mouth every 4 (four) hours as needed for severe pain. 10/01/17  Yes Shaune PollackIsaacs, Cameron, MD  azithromycin (ZITHROMAX Z-PAK) 250 MG tablet Take as directed on pack Patient not taking: Reported on 09/29/2017 08/29/16   Linna HoffKindl, James D, MD  cyclobenzaprine (FLEXERIL) 10  MG tablet Take 1 tablet (10 mg total) by mouth 2 (two) times daily as needed for muscle spasms. 09/29/17   Janne NapoleonNeese, Hope M, NP  diclofenac (VOLTAREN) 50 MG EC tablet Take 1 tablet (50 mg total) by mouth 2 (two) times daily. 09/29/17   Janne NapoleonNeese, Hope M, NP  ibuprofen (ADVIL,MOTRIN) 200 MG tablet Take 600 mg by mouth daily as needed for moderate pain.     [provider]  ipratropium (ATROVENT) 0.06 % nasal spray Place 2 sprays into both nostrils 4 (four) times daily. Patient not taking: Reported on 09/29/2017 08/29/16   Linna HoffKindl, James D, MD  methylPREDNISolone (MEDROL DOSEPAK) 4 MG TBPK tablet  10/06/17   [provider]  norethindrone (MICRONOR,CAMILA,ERRIN) 0.35 MG tablet  12/24/14   [provider]  ondansetron (ZOFRAN ODT) 4 MG disintegrating tablet Take 1 tablet (4 mg total) by mouth every 8 (eight) hours as needed for nausea or vomiting. 10/01/17   Shaune PollackIsaacs, Cameron, MD    Family History History reviewed. No pertinent family history.  Social History Social History   Tobacco Use  . Smoking status: Never Smoker  . Smokeless tobacco: Never Used  Substance Use Topics  . Alcohol use: No  . Drug use: Not on file     Allergies   Patient has no known allergies.   Review of Systems Review of Systems  All systems reviewed and negative, other than as noted in HPI.  Physical Exam Updated Vital Signs BP 133/63  Pulse 76   Temp 98.3 F (36.8 C)   Resp 16   Ht 5' (1.524 m)   Wt 49.9 kg (110 lb)   LMP 09/15/2017   SpO2 99%   BMI 21.48 kg/m   Physical Exam  Constitutional: She appears well-developed and well-nourished. No distress.  HENT:  Head: Normocephalic and atraumatic.  Eyes: Conjunctivae are normal. Right eye exhibits no discharge. Left eye exhibits no discharge.  Neck: Neck supple.  Cardiovascular: Normal rate, regular rhythm and normal heart sounds. Exam reveals no gallop and no friction rub.  No murmur heard. Pulmonary/Chest: Effort normal and  breath sounds normal. No respiratory distress.  Abdominal: Soft. She exhibits no distension. There is tenderness.  Upper quadrant tenderness extending into the right flank without rebound or guarding.  No distention.  Musculoskeletal: She exhibits no edema or tenderness.  Neurological: She is alert.  Skin: Skin is warm and dry.  Psychiatric: Thought content normal.  Somewhat labile. Crying at times.   Nursing note and vitals reviewed.    ED Treatments / Results  Labs (all labs ordered are listed, but only abnormal results are displayed) Labs Reviewed - No data to display  EKG  EKG Interpretation None       Radiology No results found.  Procedures Procedures (including critical care time)  Medications Ordered in ED Medications  sodium chloride 0.9 % bolus 1,000 mL (not administered)  haloperidol lactate (HALDOL) injection 2 mg (not administered)  morphine 4 MG/ML injection 6 mg (not administered)  dicyclomine (BENTYL) capsule 10 mg (not administered)     Initial Impression / Assessment and Plan / ED Course  I have reviewed the triage vital signs and the nursing notes.  Pertinent labs & imaging results that were available during my care of the patient were reviewed by me and considered in my medical decision making (see chart for details).    21yF with recurrent abdominal/back pain. Difficult situation. Frustrating for patient and myself as a provider. I cannot objectively explain why she is having so much pain. Extensive history dating back years per review of records. Recent ED w/u fairly unremarkable.  Possibly mesenteric adenitis?    Long discussion was had with her with her mother present. They are understandably frustrated, but I'm not sure what additionally I can offer her here in the ED today.   I cannot justify continuing to give her prescriptions for additional pain medicine.   Recommended establishing a PCP. Possible pain management.  Final Clinical  Impressions(s) / ED Diagnoses   Final diagnoses:  Abdominal pain, unspecified abdominal location    ED Discharge Orders    None       Raeford RazorKohut, Kamille Toomey, MD 10/15/17 1424

## 2017-10-06 NOTE — ED Notes (Signed)
Bed: WA02 Expected date:  Expected time:  Means of arrival:  Comments: EMS/abd. pain 

## 2017-10-06 NOTE — ED Notes (Signed)
Pt given warm blankets and heat packs to help with pain.

## 2017-10-06 NOTE — ED Triage Notes (Signed)
Pt c/o bilat upper quadrant abdomen pain and right lower back/flank pain x1 week. Pt has a hx of takayasu arteritis and states this pain is consistent flare ups r/t disease process. Pt was prescribed pain medication last visit and has given little relief. LBM was 8 days ago. EMS gace fentanyl en route to this facility. Pt denies N/V/D, chest pain, shortness of breath, or urinary symptoms.

## 2017-10-08 DIAGNOSIS — R109 Unspecified abdominal pain: Secondary | ICD-10-CM | POA: Diagnosis not present

## 2017-10-08 DIAGNOSIS — G8929 Other chronic pain: Secondary | ICD-10-CM | POA: Diagnosis not present

## 2017-10-13 ENCOUNTER — Ambulatory Visit (INDEPENDENT_AMBULATORY_CARE_PROVIDER_SITE_OTHER): Payer: Self-pay | Admitting: Physical Medicine and Rehabilitation

## 2017-10-18 ENCOUNTER — Encounter (INDEPENDENT_AMBULATORY_CARE_PROVIDER_SITE_OTHER): Payer: Self-pay | Admitting: Physical Medicine and Rehabilitation

## 2017-10-18 ENCOUNTER — Telehealth (INDEPENDENT_AMBULATORY_CARE_PROVIDER_SITE_OTHER): Payer: Self-pay | Admitting: Physical Medicine and Rehabilitation

## 2017-10-18 ENCOUNTER — Other Ambulatory Visit (INDEPENDENT_AMBULATORY_CARE_PROVIDER_SITE_OTHER): Payer: Self-pay | Admitting: Physical Medicine and Rehabilitation

## 2017-10-18 ENCOUNTER — Ambulatory Visit (INDEPENDENT_AMBULATORY_CARE_PROVIDER_SITE_OTHER): Payer: BLUE CROSS/BLUE SHIELD | Admitting: Physical Medicine and Rehabilitation

## 2017-10-18 VITALS — BP 113/71 | HR 80

## 2017-10-18 DIAGNOSIS — M545 Low back pain, unspecified: Secondary | ICD-10-CM

## 2017-10-18 DIAGNOSIS — M314 Aortic arch syndrome [Takayasu]: Secondary | ICD-10-CM

## 2017-10-18 DIAGNOSIS — G8929 Other chronic pain: Secondary | ICD-10-CM

## 2017-10-18 DIAGNOSIS — R1010 Upper abdominal pain, unspecified: Secondary | ICD-10-CM

## 2017-10-18 MED ORDER — TIZANIDINE HCL 4 MG PO TABS: 4.0000 mg | ORAL_TABLET | Freq: Every day | ORAL | 1 refills | Status: DC

## 2017-10-18 NOTE — Progress Notes (Deleted)
Right lower back pain. Pain does not increase with activity or position. Goes away on its own. Has had symptoms for about 4 years. Also having upper abdominal pain that she wants to mention.

## 2017-10-18 NOTE — Telephone Encounter (Signed)
Sent to pharm on record 

## 2017-10-20 ENCOUNTER — Encounter (INDEPENDENT_AMBULATORY_CARE_PROVIDER_SITE_OTHER): Payer: Self-pay | Admitting: Physical Medicine and Rehabilitation

## 2017-10-20 DIAGNOSIS — M545 Low back pain, unspecified: Secondary | ICD-10-CM | POA: Insufficient documentation

## 2017-10-20 DIAGNOSIS — M314 Aortic arch syndrome [Takayasu]: Secondary | ICD-10-CM | POA: Insufficient documentation

## 2017-10-20 DIAGNOSIS — R1010 Upper abdominal pain, unspecified: Secondary | ICD-10-CM | POA: Insufficient documentation

## 2017-10-20 DIAGNOSIS — G8929 Other chronic pain: Secondary | ICD-10-CM | POA: Insufficient documentation

## 2017-10-20 NOTE — Progress Notes (Signed)
Kelsey Myers - 21 y.o. female MRN [id number]  Date of birth: 1996-10-30  Office Visit Note: Visit Date: 10/18/2017 PCP: System, Provider Not In Referred by: No ref. provider found  Subjective: Chief Complaint  Patient presents with  . Lower Back - Pain  . 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  She comes in today at the request of a company in [place] which is an [employer] company called [employer].  I did review 10 pages of notes from that company.  Also reviewed the complete notes from the emergency department 3 visits.  We also had notes from [facility] from her initial diagnosis of the arteritis and those were reviewed as well.  Evidently this company searches out physicians in the area of the patient referral to.  She continues to have chronic back pain that she refers to his flank pain but it is in the right lower part of the back and does radiate into the lateral part of the upper hip area.  She also gets generalized upper abdominal pain across the upper abdomen.  This is a chronic situation where about once a month she has a severe flareup with significant pain for a few days.  Fortunately these of all been self-limiting.  He will tried different medications although nothing really seems to help very much.  She has had Flexeril in the past which really was not very beneficial.  She has had recent flareup where she ended up in the emergency department on 3 occasions since 09/29/2017.  She has had a recent CT angiogram of the abdomen and pelvis which was only revealing for changes in the arterial structure consistent with the arteritis but no active disease.  Her markers for inflammation have been low and have not been high for several years.  More recently she was in the emergency department a few days ago and they did ultrasounds of the abdomen and pelvis.   She did have a referral to a gynecologist who is working up for possible other reasons for her abdominal pain including endometriosis.  She has had full workup with gastroenterologist.  She is continued to be followed by her rheumatologist in [place].  She is from [place] but goes to school now at [employer].  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  This does not seem to limit her ability to perform schoolwork.  She does have these intermittent but regular flareups that do seem to limit what she can do for a few days.  1 of the visits to the emergency department she obtained some oxycodone but she does not like to take it very much and it does not seem to help.  At one point in her history she was treated at [facility] and she was under the care of the pain management group there and was on multiple high-dose opioids and adjunctive medication.  She is completely off of that now and reports that that was a really bad time being all that medication and it did not really seem to help much.  She does endorse the fact that she does not sleep well at night.  She does try to exercise some.  She also has a history of being a Conservator, museum/gallery at some point.  She has not had any  imaging of the spine in general.  She has not had any recent MRI imaging of the spine although she feels like may be 3 or 4 years ago in [place] there was an MRI performed.  She has had multiple bouts of physical therapy but not recently.  Her goal would be to try to figure out a source of this pain in some way to treat it where it does not seem to flareup as often.  She does understand that it does not seem to be a flareup of the arteritis.  She denies any numbness tingling or paresthesias down the legs.  She does not report any specific motion change or activity that brings on the back pain.  The back and what she refers to his flank pain does seem to be worse than the  abdominal pain.  She has not had any focal weakness or foot drop.  She said no fevers chills or night sweats or unexplained weight loss.    Review of Systems  Constitutional: Negative for chills, fever, malaise/fatigue and weight loss.  HENT: Negative for hearing loss and sinus pain.   Eyes: Negative for blurred vision, double vision and photophobia.  Respiratory: Negative for cough and shortness of breath.   Cardiovascular: Negative for chest pain, palpitations and leg swelling.  Gastrointestinal: Positive for abdominal pain. Negative for nausea and vomiting.  Genitourinary: Negative for flank pain.  Musculoskeletal: Positive for back pain. Negative for myalgias.  Skin: Negative for itching and rash.  Neurological: Negative for tremors, focal weakness and weakness.  Endo/Heme/Allergies: Negative.   Psychiatric/Behavioral: Negative for depression.  All other systems reviewed and are negative.  Otherwise per HPI.  Assessment & Plan: Visit Diagnoses:  1. Chronic right-sided low back pain without sciatica   2. Pain of upper abdomen   3. Takayasu's arteritis (HCC)     Plan: Findings:  Chronic pain syndrome status post talk at [facility]'s arteritis with significant flareup several years ago but essentially in remission from an inflammatory standpoint.  He has had pain since that time particularly in the upper abdomen in the right lower back.  She has had multiple workups for this and multiple treatments without much relief.  I discussed this with her and she understands it is complicated issue and we joked about her being a mystery.  She does well otherwise is doing well in school.  She does get these intermittent severe flareups.  I think the first step would be MRI of the lumbar spine to see if there is anything spine related that could cause the back pain and this may be unrelated to the arteritis.  She does have a history of competitive cheerleading.  She may have changes to the spine that she  would ordinarily expect from a 21 year old.  Exam is essentially benign except for tenderness along the paraspinal and quadratus muscular area on the right.  He actually has some tenderness to palpation lightly on the skin.  Not really a frank allodynia and it is more of a patchy area.  The arteritis that she had is typically big vessels but I guess underlying arteritis to small nerve fibers could explain something like that.  Nonetheless the first step would be MRI of the lumbar spine.  She has difficulty sleeping and I think that is a big deal overall in terms of musculoskeletal pain.  We are going to try tizanidine as a muscle relaxer and something that may help her sleep at night.  Depending on how she  tolerates it we may try low dose during the day.  She has tried amitriptyline and medications like that in the past and it did help her sleep but she did not like the way it made her feel.  We are also going to start her on a course of physical therapy looking mainly at evaluation and musculoskeletal and myofascial sources of pain.  I want her to see the folks at [facility] and I did write a handwritten prescription for that.  We will see her back for MRI review.  If she does have a flareup she can call us to set of going to the emergency department.  We discussed though that if she felt like it was a significant flare would be better to go there if they needed to evaluate with blood work etc.  From a pain standpoint we be happy to try to get her through these situations where it seems to flareup.  I did review the [place] controlled substance database and she is only received 2 prescriptions for opioids years with 1 of those being recently from the emergency department.    Meds & Orders: No orders of the defined types were placed in this encounter.   Orders Placed This Encounter  Procedures  . MR LUMBAR SPINE WO CONTRAST    Follow-up: Return if symptoms worsen or fail to improve.     Procedures: No procedures performed  No notes on file   Clinical History: ULTRASOUND ABDOMEN LIMITED RIGHT UPPER QUADRANT  COMPARISON: CT 09/29/2017.   IMPRESSION: No cholelithiasis or sonographic evidence for acute cholecystitis.   Electronically Signed By: Annia Beltrew Davis M.D. On: 10/01/2017 19:29  TRANSABDOMINAL ULTRASOUND OF PELVIS  DOPPLER ULTRASOUND OF OVARIES  TECHNIQUE: Transabdominal ultrasound examination of the pelvis was performed including evaluation of the uterus, ovaries, adnexal regions, and pelvic cul-de-sac.  Color and duplex Doppler ultrasound was utilized to evaluate blood flow to the ovaries.  IMPRESSION: Limited exam.  No acute process given limited TA technique.   CLINICAL DATA:  Right flank and back pain. History of Takayasu arteritis.  EXAM: CT ANGIOGRAPHY CHEST, ABDOMEN AND PELVIS  TECHNIQUE: Multidetector CT imaging through the chest, abdomen and pelvis was performed using the standard protocol during bolus administration of intravenous contrast. Multiplanar reconstructed images and MIPs were obtained and reviewed to evaluate the vascular anatomy. The patient movement during bolus timing leading to suboptimal contrast bolus timing.  CONTRAST:  80 cc Isovue 370 IV  COMPARISON:  No prior exams available for comparison. Report from chest MRI 03/01/2014 at an outside institution reviewed  FINDINGS: CTA CHEST FINDINGS  Cardiovascular: No aortic aneurysm, stenosis or hematoma. Majority of IV contrast is within the pulmonary arteries, however exam is diagnostic to rule out aortic dissection. There is faint aortic wall thickening of the descending aorta. Normal heart size. No pulmonary embolus. Minimal pericardial fluid is physiologic.  Mediastinum/Nodes: Minimal soft tissue density in the anterior mediastinum consistent with residual thymus, normal for age. No adenopathy. The esophagus is decompressed. Visualized  thyroid gland is normal.  Lungs/Pleura: Clear lungs. No consolidation, pleural fluid or pulmonary edema.  Musculoskeletal: Mild broad-based scoliotic curvature of the lower thoracic and lumbar spine. There are no acute or suspicious osseous abnormalities.  Review of the MIP images confirms the above findings.  CTA ABDOMEN AND PELVIS FINDINGS  VASCULAR  Aorta: Normal caliber aorta without aneurysm, dissection, vasculitis or significant stenosis. No significant luminal regularity.  Celiac: Patent without evidence of aneurysm, dissection, vasculitis or significant  stenosis.  SMA: Patent without evidence of aneurysm, dissection, vasculitis or significant stenosis.  Renals: 2 right renal arteries with more superior artery supplying approximately 2/3 and more inferior artery supplying 1/3 of the kidney. Single left renal artery. All renal arteries are patent without evidence of aneurysm, dissection, vasculitis, fibromuscular dysplasia or significant stenosis.  IMA: Patent without evidence of aneurysm, dissection, vasculitis or significant stenosis.  Inflow: Patent without evidence of aneurysm, dissection, vasculitis or significant stenosis.  Veins: No obvious venous abnormality within the limitations of this arterial phase study.  Review of the MIP images confirms the above findings.  NON-VASCULAR  Hepatobiliary: No focal liver abnormality is seen. No gallstones, gallbladder wall thickening, or biliary dilatation.  Pancreas: No ductal dilatation or inflammation.  Spleen: Normal arterial phase imaging.  Normal in size.  Adrenals/Urinary Tract: Normal adrenal glands. Homogeneous arterial enhancement. No hydronephrosis or perinephric edema. Urinary bladder physiologically distended, no wall thickening.  Stomach/Bowel: Stomach is within normal limits. Appendix appears normal. No evidence of bowel wall thickening, distention, or inflammatory changes.  Moderate colonic stool burden without colonic inflammation. Tortuous transverse colon.  Lymphatic: Scattered prominent ileocolic nodes, largest measuring 8 mm in short axis. No retroperitoneal or pelvic adenopathy.  Reproductive: Uterus and bilateral adnexa are unremarkable. Small amount of free fluid in the pelvis is physiologic.  Other: No free air, ascites or intra-abdominal abscess.  Musculoskeletal: There are no acute or suspicious osseous abnormalities.  Review of the MIP images confirms the above findings.  IMPRESSION: 1. No acute aortic or vascular abnormality. Trace descending thoracic aortic wall thickening, was described on prior outside chest MRI, likely sequela of prior vasculitis. 2. Scattered prominent ileocolic nodes be reactive or represent mesenteric adenitis. 3. There is otherwise no acute abnormality of the chest, abdomen, or pelvis.   Electronically Signed   By: Rubye Oaks M.D.   On: 09/29/2017 22:10  She reports that  has never smoked. she has never used smokeless tobacco. No results for input(s): HGBA1C, LABURIC in the last 8760 hours.  Objective:  VS:  HT:    WT:   BMI:     BP:113/71  HR:80bpm  TEMP: ( )  RESP:  Physical Exam  Constitutional: She is oriented to person, place, and time. She appears well-developed and well-nourished. No distress.  HENT:  Head: Normocephalic and atraumatic.  Nose: Nose normal.  Mouth/Throat: Oropharynx is clear and moist.  Eyes: Conjunctivae are normal. Pupils are equal, round, and reactive to light.  Neck: Normal range of motion. Neck supple.  Cardiovascular: Regular rhythm and intact distal pulses.  Pulmonary/Chest: Effort normal. No respiratory distress.  Abdominal: Soft. Normal appearance and bowel sounds are normal. She exhibits no distension and no mass. There is no tenderness. There is no guarding.  Musculoskeletal:  Patient ambulates without aid.  She arises without difficulty from a seated  position.  She does have some pain with extension rotation to the right.  She does not have any severe pain with rocking over the vertebral bodies or PSIS.  Mild pain over the greater trochanter on the right.  She has no pain with hip rotation she has good distal strength.  She has 2+ muscle stretch reflexes at the quadriceps and gastrocnemius.  There is no clonus bilaterally.  She has intact pulses of the lower extremities without edema.  She is tender over the right lower back and paraspinal region.  She is very tender to even light touch in this area.  Almost an anesthesia dolorosa type of  effect.  Neurological: She is alert and oriented to person, place, and time. She exhibits normal muscle tone. Coordination normal.  Skin: Skin is warm. No rash noted. No erythema.  Psychiatric: She has a normal mood and affect. Her behavior is normal.  Nursing note and vitals reviewed.   Ortho Exam Imaging: No results found.  Past Medical/Family/Surgical/Social History: Medications & Allergies reviewed per EMR Patient Active Problem List   Diagnosis Date Noted  . Chronic right-sided low back pain without sciatica 10/20/2017  . Pain of upper abdomen 10/20/2017  . Takayasu's arteritis (HCC) 10/20/2017   Past Medical History:  Diagnosis Date  . Takayasu's arteritis (HCC)    History reviewed. No pertinent family history. History reviewed. No pertinent surgical history. Social History   Occupational History  . Not on file  Tobacco Use  . Smoking status: Never Smoker  . Smokeless tobacco: Never Used  Substance and Sexual Activity  . Alcohol use: No  . Drug use: Not on file  . Sexual activity: Not on file

## 2017-10-27 ENCOUNTER — Telehealth (INDEPENDENT_AMBULATORY_CARE_PROVIDER_SITE_OTHER): Payer: Self-pay | Admitting: Physical Medicine and Rehabilitation

## 2017-10-27 NOTE — Telephone Encounter (Signed)
This is being taken care please see referral note.

## 2017-10-27 NOTE — Telephone Encounter (Signed)
This was sent to me. Same patient as a voicemail I transferred to you earlier.

## 2017-10-27 NOTE — Telephone Encounter (Signed)
Patients dad called to see about moving injection to another facility due to insurance. Please call him so he will know what to do

## 2017-11-01 DIAGNOSIS — M545 Low back pain: Secondary | ICD-10-CM | POA: Diagnosis not present

## 2017-11-05 ENCOUNTER — Telehealth (INDEPENDENT_AMBULATORY_CARE_PROVIDER_SITE_OTHER): Payer: Self-pay | Admitting: Physical Medicine and Rehabilitation

## 2017-11-05 NOTE — Telephone Encounter (Signed)
10/18/2017 OP NOTE FAXED TO DR Huntley DecSARA PENN [phone]. CHART INDICATES PATIENT HAS NOT HAD MRI YET, SO ONLY THE OV NOTE WAS FAXED.

## 2017-11-09 ENCOUNTER — Telehealth: Payer: Self-pay

## 2017-11-09 NOTE — Telephone Encounter (Signed)
Kelsey Myers with Dr. Noah CharonPenn's office called concerning OV note.

## 2017-11-14 DIAGNOSIS — R109 Unspecified abdominal pain: Secondary | ICD-10-CM | POA: Diagnosis not present

## 2017-11-14 DIAGNOSIS — M314 Aortic arch syndrome [Takayasu]: Secondary | ICD-10-CM | POA: Diagnosis not present

## 2017-11-14 DIAGNOSIS — Z79899 Other long term (current) drug therapy: Secondary | ICD-10-CM | POA: Diagnosis not present

## 2017-11-14 DIAGNOSIS — Z3202 Encounter for pregnancy test, result negative: Secondary | ICD-10-CM | POA: Diagnosis not present

## 2017-11-14 DIAGNOSIS — I359 Nonrheumatic aortic valve disorder, unspecified: Secondary | ICD-10-CM | POA: Diagnosis not present

## 2017-11-14 DIAGNOSIS — G8929 Other chronic pain: Secondary | ICD-10-CM | POA: Diagnosis not present

## 2017-11-14 DIAGNOSIS — M545 Low back pain: Secondary | ICD-10-CM | POA: Diagnosis not present

## 2017-11-14 DIAGNOSIS — R1013 Epigastric pain: Secondary | ICD-10-CM | POA: Diagnosis not present

## 2017-11-19 DIAGNOSIS — Z79899 Other long term (current) drug therapy: Secondary | ICD-10-CM | POA: Diagnosis not present

## 2017-11-19 DIAGNOSIS — M314 Aortic arch syndrome [Takayasu]: Secondary | ICD-10-CM | POA: Diagnosis not present

## 2017-11-19 DIAGNOSIS — M545 Low back pain: Secondary | ICD-10-CM | POA: Diagnosis not present

## 2017-11-19 DIAGNOSIS — G894 Chronic pain syndrome: Secondary | ICD-10-CM | POA: Diagnosis not present

## 2017-11-26 DIAGNOSIS — R109 Unspecified abdominal pain: Secondary | ICD-10-CM | POA: Diagnosis not present

## 2017-11-26 DIAGNOSIS — M314 Aortic arch syndrome [Takayasu]: Secondary | ICD-10-CM | POA: Diagnosis not present

## 2017-12-14 ENCOUNTER — Other Ambulatory Visit (INDEPENDENT_AMBULATORY_CARE_PROVIDER_SITE_OTHER): Payer: Self-pay | Admitting: Physical Medicine and Rehabilitation

## 2017-12-14 NOTE — Telephone Encounter (Signed)
Please advise 

## 2017-12-26 ENCOUNTER — Other Ambulatory Visit: Payer: Self-pay

## 2017-12-26 ENCOUNTER — Emergency Department (HOSPITAL_COMMUNITY)
Admission: EM | Admit: 2017-12-26 | Discharge: 2017-12-27 | Disposition: A | Discharge status: Home / Self Care | Payer: BLUE CROSS/BLUE SHIELD | Attending: Emergency Medicine | Admitting: Emergency Medicine

## 2017-12-26 ENCOUNTER — Encounter (HOSPITAL_COMMUNITY): Payer: Self-pay | Admitting: Emergency Medicine

## 2017-12-26 DIAGNOSIS — R1031 Right lower quadrant pain: Secondary | ICD-10-CM | POA: Insufficient documentation

## 2017-12-26 DIAGNOSIS — R55 Syncope and collapse: Secondary | ICD-10-CM | POA: Diagnosis not present

## 2017-12-26 DIAGNOSIS — W1830XA Fall on same level, unspecified, initial encounter: Secondary | ICD-10-CM | POA: Insufficient documentation

## 2017-12-26 DIAGNOSIS — R109 Unspecified abdominal pain: Secondary | ICD-10-CM | POA: Diagnosis not present

## 2017-12-26 DIAGNOSIS — R112 Nausea with vomiting, unspecified: Secondary | ICD-10-CM | POA: Insufficient documentation

## 2017-12-26 DIAGNOSIS — R1011 Right upper quadrant pain: Secondary | ICD-10-CM | POA: Diagnosis not present

## 2017-12-26 DIAGNOSIS — M314 Aortic arch syndrome [Takayasu]: Secondary | ICD-10-CM | POA: Diagnosis not present

## 2017-12-26 DIAGNOSIS — Y92239 Unspecified place in hospital as the place of occurrence of the external cause: Secondary | ICD-10-CM | POA: Insufficient documentation

## 2017-12-26 DIAGNOSIS — Z79899 Other long term (current) drug therapy: Secondary | ICD-10-CM | POA: Diagnosis not present

## 2017-12-26 LAB — COMPREHENSIVE METABOLIC PANEL
ALK PHOS: 51 U/L (ref 38–126)
ALT: 16 U/L (ref 14–54)
AST: 25 U/L (ref 15–41)
Albumin: 4.7 g/dL (ref 3.5–5.0)
Anion gap: 8 (ref 5–15)
BILIRUBIN TOTAL: 0.3 mg/dL (ref 0.3–1.2)
BUN: 15 mg/dL (ref 6–20)
CALCIUM: 9.1 mg/dL (ref 8.9–10.3)
CO2: 20 mmol/L — ABNORMAL LOW (ref 22–32)
Chloride: 108 mmol/L (ref 101–111)
Creatinine, Ser: 0.7 mg/dL (ref 0.44–1.00)
GFR calc Af Amer: >60 mL/min (ref >60)
Glucose, Bld: 99 mg/dL (ref 65–99)
Potassium: 3.8 mmol/L (ref 3.5–5.1)
Sodium: 136 mmol/L (ref 135–145)
TOTAL PROTEIN: 7.5 g/dL (ref 6.5–8.1)

## 2017-12-26 LAB — URINALYSIS, ROUTINE W REFLEX MICROSCOPIC
BILIRUBIN URINE: NEGATIVE
Bacteria, UA: NONE SEEN
GLUCOSE, UA: NEGATIVE mg/dL
HGB URINE DIPSTICK: NEGATIVE
Ketones, ur: 5 mg/dL — AB
NITRITE: NEGATIVE
PH: 5 (ref 5.0–8.0)
Protein, ur: 30 mg/dL — AB
SPECIFIC GRAVITY, URINE: 1.035 — AB (ref 1.005–1.030)

## 2017-12-26 LAB — CBC
HCT: 40.9 % (ref 36.0–46.0)
Hemoglobin: 14 g/dL (ref 12.0–15.0)
MCH: 30 pg (ref 26.0–34.0)
MCHC: 34.2 g/dL (ref 30.0–36.0)
MCV: 87.6 fL (ref 78.0–100.0)
PLATELETS: 359 10*3/uL (ref 150–400)
RBC: 4.67 MIL/uL (ref 3.87–5.11)
RDW: 12.6 % (ref 11.5–15.5)
WBC: 9.4 10*3/uL (ref 4.0–10.5)

## 2017-12-26 LAB — PREGNANCY, URINE: Preg Test, Ur: NEGATIVE

## 2017-12-26 LAB — LIPASE, BLOOD: Lipase: 33 U/L (ref 11–51)

## 2017-12-26 MED ORDER — KETOROLAC TROMETHAMINE 30 MG/ML IJ SOLN
15.0000 mg | Freq: Once | INTRAMUSCULAR | Status: AC
  Administered 2017-12-27: 15 mg via INTRAVENOUS
  Filled 2017-12-26: qty 1

## 2017-12-26 MED ORDER — IOPAMIDOL (ISOVUE-300) INJECTION 61%
INTRAVENOUS | Status: AC
  Administered 2017-12-27: 30 mL via ORAL
  Filled 2017-12-26: qty 100

## 2017-12-26 MED ORDER — IOPAMIDOL (ISOVUE-300) INJECTION 61%
30.0000 mL | Freq: Once | INTRAVENOUS | Status: AC | PRN
  Administered 2017-12-27: 30 mL via ORAL

## 2017-12-26 MED ORDER — FENTANYL CITRATE (PF) 100 MCG/2ML IJ SOLN
50.0000 ug | INTRAMUSCULAR | Status: DC | PRN
  Administered 2017-12-26: 50 ug via INTRAVENOUS

## 2017-12-26 MED ORDER — SODIUM CHLORIDE 0.9 % IV BOLUS (SEPSIS)
1000.0000 mL | Freq: Once | INTRAVENOUS | Status: AC
  Administered 2017-12-27: 1000 mL via INTRAVENOUS

## 2017-12-26 MED ORDER — IOPAMIDOL (ISOVUE-300) INJECTION 61%
INTRAVENOUS | Status: AC
  Filled 2017-12-26: qty 30

## 2017-12-26 MED ORDER — ONDANSETRON HCL 4 MG/2ML IJ SOLN
4.0000 mg | Freq: Once | INTRAMUSCULAR | Status: AC
  Administered 2017-12-27: 4 mg via INTRAVENOUS
  Filled 2017-12-26: qty 2

## 2017-12-26 MED ORDER — FENTANYL CITRATE (PF) 100 MCG/2ML IJ SOLN
INTRAMUSCULAR | Status: AC
  Filled 2017-12-26: qty 2

## 2017-12-26 MED ORDER — ONDANSETRON 4 MG PO TBDP
4.0000 mg | ORAL_TABLET | Freq: Once | ORAL | Status: AC | PRN
  Administered 2017-12-26: 4 mg via ORAL
  Filled 2017-12-26: qty 1

## 2017-12-26 MED ORDER — HYDROMORPHONE HCL 1 MG/ML IJ SOLN
1.0000 mg | Freq: Once | INTRAMUSCULAR | Status: AC
  Administered 2017-12-27: 1 mg via INTRAVENOUS
  Filled 2017-12-26: qty 1

## 2017-12-26 NOTE — ED Notes (Signed)
Pt found in floor after doubling over from pain. Pt reports she hit her head and that it hurts. Pt is tearful in triage area.

## 2017-12-26 NOTE — ED Triage Notes (Signed)
Pt reports for the last week having abd pain with back pain along with nausea and vomiting. Pt reports being in remission for Takayasu's syndrome.

## 2017-12-27 ENCOUNTER — Emergency Department (HOSPITAL_COMMUNITY): Payer: BLUE CROSS/BLUE SHIELD

## 2017-12-27 ENCOUNTER — Encounter (HOSPITAL_COMMUNITY): Payer: Self-pay

## 2017-12-27 DIAGNOSIS — R109 Unspecified abdominal pain: Secondary | ICD-10-CM | POA: Diagnosis not present

## 2017-12-27 MED ORDER — KETOROLAC TROMETHAMINE 30 MG/ML IJ SOLN
15.0000 mg | Freq: Once | INTRAMUSCULAR | Status: AC
  Administered 2017-12-27: 15 mg via INTRAVENOUS
  Filled 2017-12-27: qty 1

## 2017-12-27 MED ORDER — IOPAMIDOL (ISOVUE-300) INJECTION 61%: 100.0000 mL | Freq: Once | INTRAVENOUS | Status: DC | PRN

## 2017-12-27 MED ORDER — METOCLOPRAMIDE HCL 5 MG/ML IJ SOLN
10.0000 mg | Freq: Once | INTRAMUSCULAR | Status: AC
  Administered 2017-12-27: 10 mg via INTRAVENOUS
  Filled 2017-12-27: qty 2

## 2017-12-27 MED ORDER — HYDROMORPHONE HCL 1 MG/ML IJ SOLN
1.0000 mg | Freq: Once | INTRAMUSCULAR | Status: DC
  Filled 2017-12-27: qty 1

## 2017-12-27 MED ORDER — ONDANSETRON HCL 4 MG PO TABS: 4.0000 mg | ORAL_TABLET | Freq: Four times a day (QID) | ORAL | 0 refills | Status: DC

## 2017-12-27 MED ORDER — OXYCODONE-ACETAMINOPHEN 5-325 MG PO TABS: 1.0000 | ORAL_TABLET | ORAL | 0 refills | Status: DC | PRN

## 2017-12-27 NOTE — ED Provider Notes (Signed)
[facility]-EMERGENCY DEPT Provider Note   CSN: [id number] Arrival date & time: 12/26/17  2041     History   Chief Complaint Chief Complaint  Patient presents with  . Abdominal Pain  . Back Pain    HPI Kelsey Myers is a 22 y.o. female.  Patient presents to the emergency department for evaluation of right-sided abdominal and flank pain with nausea and vomiting.  She reports that she has a history of Takayasu arteritis, but is reportedly in remission.  She reports that she still frequently gets this pain that she has connected to her previous diagnosis.  She says it usually happens approximately once a month.  Normally last for several days or a week, but this has been more persistent and more severe.  She has constant sharp stabbing pain in the right flank associated with nausea and vomiting.  She has not had a fever.      Past Medical History:  Diagnosis Date  . Takayasu's arteritis [facility])     Patient Active Problem List   Diagnosis Date Noted  . Chronic right-sided low back pain without sciatica 10/20/2017  . Pain of upper abdomen 10/20/2017  . Takayasu's arteritis (HCC) 10/20/2017    History reviewed. No pertinent surgical history.  OB History    No data available       Home Medications    Prior to Admission medications   Medication Sig Start Date End Date Taking? Authorizing Provider  ibuprofen (ADVIL,MOTRIN) 200 MG tablet Take 600 mg by mouth daily as needed for moderate pain.     [provider]  JUNEL FE 1/20 1-20 MG-MCG tablet TK 1 T PO QD. PATIENT NEEDS TO SCHEDULE ANNUAL APPOINTMENT PRIOR TO FURTHER REFILLS. [facility] FOR MICROGESTIN FE. 08/09/17   [provider]  methylPREDNISolone (MEDROL DOSEPAK) 4 MG TBPK tablet  10/06/17   [provider]  norethindrone (MICRONOR,CAMILA,ERRIN) 0.35 MG tablet  12/24/14   [provider]  omeprazole (PRILOSEC) 20 MG capsule Take 1 capsule (20 mg total) by  mouth daily. 10/01/17   Shaune Pollack, MD  ondansetron (ZOFRAN) 4 MG tablet Take 1 tablet (4 mg total) by mouth every 6 (six) hours. 12/27/17   Gilda Crease, MD  oxyCODONE-acetaminophen (PERCOCET) 5-325 MG tablet Take 1 tablet by mouth every 4 (four) hours as needed. 12/27/17   Gilda Crease, MD  tiZANidine (ZANAFLEX) 4 MG tablet TAKE 1 TABLET BY MOUTH EVERY NIGHT AT BEDTIME 12/14/17   Tyrell Antonio, MD    Family History History reviewed. No pertinent family history.  Social History Social History   Tobacco Use  . Smoking status: Never Smoker  . Smokeless tobacco: Never Used  Substance Use Topics  . Alcohol use: No  . Drug use: No     Allergies   Patient has no known allergies.   Review of Systems Review of Systems  Gastrointestinal: Positive for nausea and vomiting.  Genitourinary: Positive for flank pain.  All other systems reviewed and are negative.    Physical Exam Updated Vital Signs BP 126/84 (BP Location: Right Arm)   Pulse 81   Temp 98.1 F (36.7 C) (Oral)   Resp 16   Ht 5' (1.524 m)   Wt 49.9 kg (110 lb)   LMP 11/12/2017 Comment: negative urine pregnancy test 12/26/17  SpO2 100%   BMI 21.48 kg/m   Physical Exam  Constitutional: She is oriented to person, place, and time. She appears well-developed and well-nourished. No distress.  HENT:  Head: Normocephalic and atraumatic.  Right Ear: Hearing normal.  Left Ear: Hearing normal.  Nose: Nose normal.  Mouth/Throat: Oropharynx is clear and moist and mucous membranes are normal.  Eyes: Conjunctivae and EOM are normal. Pupils are equal, round, and reactive to light.  Neck: Normal range of motion. Neck supple.  Cardiovascular: Regular rhythm, S1 normal and S2 normal. Exam reveals no gallop and no friction rub.  No murmur heard. Pulmonary/Chest: Effort normal and breath sounds normal. No respiratory distress. She exhibits no tenderness.  Abdominal: Soft. Normal appearance and bowel sounds  are normal. There is no hepatosplenomegaly. There is tenderness in the right upper quadrant. There is CVA tenderness. There is no rebound, no guarding, no tenderness at McBurney's point and negative Murphy's sign. No hernia.  Musculoskeletal: Normal range of motion.  Neurological: She is alert and oriented to person, place, and time. She has normal strength. No cranial nerve deficit or sensory deficit. Coordination normal. GCS eye subscore is 4. GCS verbal subscore is 5. GCS motor subscore is 6.  Skin: Skin is warm, dry and intact. No rash noted. No cyanosis.  Psychiatric: She has a normal mood and affect. Her speech is normal and behavior is normal. Thought content normal.  Nursing note and vitals reviewed.    ED Treatments / Results  Labs (all labs ordered are listed, but only abnormal results are displayed) Labs Reviewed  COMPREHENSIVE METABOLIC PANEL - Abnormal; Notable for the following components:      Result Value   CO2 20 (*)    All other components within normal limits  URINALYSIS, ROUTINE W REFLEX MICROSCOPIC - Abnormal; Notable for the following components:   Specific Gravity, Urine 1.035 (*)    Ketones, ur 5 (*)    Protein, ur 30 (*)    Leukocytes, UA TRACE (*)    Squamous Epithelial / LPF 0-5 (*)    All other components within normal limits  LIPASE, BLOOD  CBC  PREGNANCY, URINE    EKG  EKG Interpretation None       Radiology Ct Abdomen Pelvis W Contrast  Result Date: 12/27/2017 CLINICAL DATA:  Abdomin and back pain for 1 week, in remission from Takayasu's syndrome, wbc's 9.4, wbc's in urine, EXAM: CT ABDOMEN AND PELVIS WITH CONTRAST TECHNIQUE: Multidetector CT imaging of the abdomen and pelvis was performed using the standard protocol following bolus administration of intravenous contrast. CONTRAST:  80 cc Isovue 300 COMPARISON:  CT dated 09/29/2017. FINDINGS: Lower chest: No acute abnormality. Hepatobiliary: No focal liver abnormality is seen. No gallstones,  gallbladder wall thickening, or biliary dilatation. Pancreas: Unremarkable. No pancreatic ductal dilatation or surrounding inflammatory changes. Spleen: Normal in size without focal abnormality. Adrenals/Urinary Tract: Adrenal glands appear normal. Kidneys appear normal without mass, stone or hydronephrosis. No perinephric fluid. No ureteral or bladder calculi identified. Bladder appears normal. Stomach/Bowel: Bowel is normal in caliber. No bowel wall thickening or evidence of bowel wall inflammation. Appendix is normal. Vascular/Lymphatic: Abdominal aorta is normal in caliber. No aortic aneurysm or evidence of dissection. Celiac, SMA and IMA branches appear patent and normal in caliber. No enlarged or morphologically abnormal lymph nodes identified. Reproductive: Fluid within the endometrial canal, likely expected changes related to menstrual cycle. Small amount of free fluid in the cul-de-sac and right adnexa, likely physiologic in nature. No adnexal mass seen. Other: No significant free fluid. No abscess collection. No free intraperitoneal air. Musculoskeletal: Osseous structures of the abdomen and pelvis are unremarkable. Superficial soft tissues are unremarkable. IMPRESSION: 1. No  acute or significant findings are identified within the abdomen or pelvis. No vascular abnormality identified. 2. Fluid density material distending the endometrial canal, presumably related to menstrual cycle. Small amount of free fluid within the lower pelvis is likely physiologic in nature. If any localizable pelvic pain, consider pelvic ultrasound to confirm benignity. Electronically Signed   By: Bary Richard M.D.   On: 12/27/2017 02:51    Procedures Procedures (including critical care time)  Medications Ordered in ED Medications  fentaNYL (SUBLIMAZE) injection 50 mcg (50 mcg Intravenous Given 12/26/17 2304)  fentaNYL (SUBLIMAZE) 100 MCG/2ML injection (  Canceled Entry 12/26/17 2304)  iopamidol (ISOVUE-300) 61 % injection  100 mL (not administered)  HYDROmorphone (DILAUDID) injection 1 mg (not administered)  ketorolac (TORADOL) 30 MG/ML injection 15 mg (not administered)  metoCLOPramide (REGLAN) injection 10 mg (not administered)  ondansetron (ZOFRAN-ODT) disintegrating tablet 4 mg (4 mg Oral Given 12/26/17 2228)  HYDROmorphone (DILAUDID) injection 1 mg (1 mg Intravenous Given 12/27/17 0029)  ondansetron (ZOFRAN) injection 4 mg (4 mg Intravenous Given 12/27/17 0026)  sodium chloride 0.9 % bolus 1,000 mL (0 mLs Intravenous Stopped 12/27/17 0032)  ketorolac (TORADOL) 30 MG/ML injection 15 mg (15 mg Intravenous Given 12/27/17 0027)  iopamidol (ISOVUE-300) 61 % injection 30 mL (30 mLs Oral Contrast Given 12/27/17 0023)     Initial Impression / Assessment and Plan / ED Course  I have reviewed the triage vital signs and the nursing notes.  Pertinent labs & imaging results that were available during my care of the patient were reviewed by me and considered in my medical decision making (see chart for details).     Patient presents to the emergency department for evaluation of right flank pain.  Pain is of unclear etiology.  She reports that this has been a recurrent thing that happens once a month for the last several years.  She carries a diagnosis of Takayasu arteritis, but reports that it is in remission.  She is complaining of right flank pain with nausea and vomiting.  Blood work and urinalysis essentially normal.  CT scan performed to rule out renal infarct causing her pain.  CT scan does not show any acute abnormality.  Patient treated with analgesia, will be discharged, follow-up with primary care.  Of note, patient reportedly fell and hit her head while here in the ER.  She reports that her pain was so bad that she briefly passed out.  Friends report that she fell forward and hit her head on the table.  She does not have any swelling, erythema, abrasions.  No concern for any significant intracranial injury.  No neck  pain.  Does not require any imaging or further workup.  Syncope secondary to pain and/or vasovagal episode.  Final Clinical Impressions(s) / ED Diagnoses   Final diagnoses:  Flank pain, acute    ED Discharge Orders        Ordered    oxyCODONE-acetaminophen (PERCOCET) 5-325 MG tablet  Every 4 hours PRN     12/27/17 0258    ondansetron (ZOFRAN) 4 MG tablet  Every 6 hours     12/27/17 0258       Gilda Crease, MD 12/27/17 [phone]

## 2017-12-27 NOTE — ED Notes (Signed)
Pt reports nausea and return of pain.  Dr. Blinda LeatherwoodPollina made aware.

## 2017-12-27 NOTE — ED Notes (Signed)
Patient transported to CT 

## 2017-12-30 ENCOUNTER — Other Ambulatory Visit: Payer: Self-pay

## 2017-12-30 ENCOUNTER — Encounter (HOSPITAL_COMMUNITY): Payer: Self-pay | Admitting: Emergency Medicine

## 2017-12-30 ENCOUNTER — Ambulatory Visit (HOSPITAL_COMMUNITY)
Admission: EM | Admit: 2017-12-30 | Discharge: 2017-12-30 | Disposition: A | Discharge status: Home / Self Care | Payer: BLUE CROSS/BLUE SHIELD | Attending: Family Medicine | Admitting: Family Medicine

## 2017-12-30 DIAGNOSIS — R1010 Upper abdominal pain, unspecified: Secondary | ICD-10-CM | POA: Diagnosis not present

## 2017-12-30 DIAGNOSIS — Z79899 Other long term (current) drug therapy: Secondary | ICD-10-CM | POA: Diagnosis not present

## 2017-12-30 DIAGNOSIS — I2589 Other forms of chronic ischemic heart disease: Secondary | ICD-10-CM | POA: Diagnosis not present

## 2017-12-30 DIAGNOSIS — G8929 Other chronic pain: Secondary | ICD-10-CM | POA: Diagnosis not present

## 2017-12-30 DIAGNOSIS — R58 Hemorrhage, not elsewhere classified: Secondary | ICD-10-CM | POA: Diagnosis not present

## 2017-12-30 DIAGNOSIS — M545 Low back pain: Secondary | ICD-10-CM | POA: Insufficient documentation

## 2017-12-30 DIAGNOSIS — R238 Other skin changes: Secondary | ICD-10-CM

## 2017-12-30 DIAGNOSIS — R233 Spontaneous ecchymoses: Secondary | ICD-10-CM

## 2017-12-30 LAB — COMPREHENSIVE METABOLIC PANEL
ALK PHOS: 51 U/L (ref 38–126)
ALT: 18 U/L (ref 14–54)
AST: 25 U/L (ref 15–41)
Albumin: 4.3 g/dL (ref 3.5–5.0)
Anion gap: 11 (ref 5–15)
BUN: 10 mg/dL (ref 6–20)
CALCIUM: 9.2 mg/dL (ref 8.9–10.3)
CO2: 25 mmol/L (ref 22–32)
CREATININE: 0.96 mg/dL (ref 0.44–1.00)
Chloride: 103 mmol/L (ref 101–111)
GFR calc non Af Amer: >60 mL/min (ref >60)
Glucose, Bld: 107 mg/dL — ABNORMAL HIGH (ref 65–99)
Potassium: 3.5 mmol/L (ref 3.5–5.1)
Sodium: 139 mmol/L (ref 135–145)
Total Bilirubin: 0.8 mg/dL (ref 0.3–1.2)
Total Protein: 7.2 g/dL (ref 6.5–8.1)

## 2017-12-30 LAB — CBC WITH DIFFERENTIAL/PLATELET
Basophils Absolute: 0 10*3/uL (ref 0.0–0.1)
Basophils Relative: 1 %
Eosinophils Absolute: 0.2 10*3/uL (ref 0.0–0.7)
Eosinophils Relative: 3 %
HEMATOCRIT: 41 % (ref 36.0–46.0)
Hemoglobin: 13.6 g/dL (ref 12.0–15.0)
LYMPHS ABS: 1.7 10*3/uL (ref 0.7–4.0)
Lymphocytes Relative: 29 %
MCH: 29.7 pg (ref 26.0–34.0)
MCHC: 33.2 g/dL (ref 30.0–36.0)
MCV: 89.5 fL (ref 78.0–100.0)
MONO ABS: 0.4 10*3/uL (ref 0.1–1.0)
MONOS PCT: 6 %
NEUTROS ABS: 3.4 10*3/uL (ref 1.7–7.7)
Neutrophils Relative %: 61 %
Platelets: 343 10*3/uL (ref 150–400)
RBC: 4.58 MIL/uL (ref 3.87–5.11)
RDW: 12.5 % (ref 11.5–15.5)
WBC: 5.6 10*3/uL (ref 4.0–10.5)

## 2017-12-30 LAB — POCT I-STAT, CHEM 8
BUN: 10 mg/dL (ref 6–20)
CREATININE: 0.9 mg/dL (ref 0.44–1.00)
Calcium, Ion: 1.19 mmol/L (ref 1.15–1.40)
Chloride: 102 mmol/L (ref 101–111)
Glucose, Bld: 104 mg/dL — ABNORMAL HIGH (ref 65–99)
HEMATOCRIT: 43 % (ref 36.0–46.0)
HEMOGLOBIN: 14.6 g/dL (ref 12.0–15.0)
Potassium: 3.5 mmol/L (ref 3.5–5.1)
SODIUM: 142 mmol/L (ref 135–145)
TCO2: 27 mmol/L (ref 22–32)

## 2017-12-30 LAB — PROTIME-INR
INR: 0.96
Prothrombin Time: 12.6 seconds (ref 11.4–15.2)

## 2017-12-30 NOTE — Discharge Instructions (Signed)
Your hemoglobin was normal on the screening blood test. The rest of the results will return in a few hours. You can log into mychart, and will be able to review the results once they are in. You will also get a call from us about the results. Discontinue duloxetine for now until results are back. Please follow up with your doctors for further management needed. If experiencing worsening symptoms, blood in stool, blood in vomit, dizziness, passing out, go to the emergency department for further evaluation needed.

## 2017-12-30 NOTE — ED Triage Notes (Signed)
Pt requesting to have her platelet levels checked.

## 2017-12-30 NOTE — ED Provider Notes (Signed)
[facility]    CSN: [id number] Arrival date & time: 12/30/17  1431     History   Chief Complaint No chief complaint on file.   HPI Kelsey Myers is a 22 y.o. female.   22 year old female with history of takayasu's arteritis, chronic right sided low back pain without sciatica comes in for bruising. States that she has been taking Cymbalta for about 1 month for her chronic back pain without problems, but was told could double up during flare ups of her back. She states she doubled up on the medicine and now noticing some bruising of the inner thighs and noticed heavier bleeding during her current cycle. She feels slightly nausea, which she thinks could be due to the medication itself. She denies blood in urine, blood in stool, other active bleeding. Denies dizziness, weakness, syncope. She called her rheumatologist in [place], who advised her to go to an urgent care/emergency department for lab work.       Past Medical History:  Diagnosis Date  . Takayasu's arteritis [facility])     Patient Active Problem List   Diagnosis Date Noted  . Chronic right-sided low back pain without sciatica 10/20/2017  . Pain of upper abdomen 10/20/2017  . Takayasu's arteritis (HCC) 10/20/2017    History reviewed. No pertinent surgical history.  OB History    No data available       Home Medications    Prior to Admission medications   Medication Sig Start Date End Date Taking? Authorizing Provider  DULoxetine HCl (CYMBALTA PO) Take by mouth.   Yes [provider]  norethindrone (MICRONOR,CAMILA,ERRIN) 0.35 MG tablet  12/24/14  Yes [provider]  ondansetron (ZOFRAN) 4 MG tablet Take 1 tablet (4 mg total) by mouth every 6 (six) hours. 12/27/17  Yes Pollina, Canary Brim, MD  oxyCODONE-acetaminophen (PERCOCET) 5-325 MG tablet Take 1 tablet by mouth every 4 (four) hours as needed. 12/27/17  Yes Pollina, Canary Brim, MD  tiZANidine (ZANAFLEX) 4 MG tablet TAKE 1  TABLET BY MOUTH EVERY NIGHT AT BEDTIME 12/14/17  Yes Tyrell Antonio, MD  ibuprofen (ADVIL,MOTRIN) 200 MG tablet Take 600 mg by mouth daily as needed for moderate pain.     [provider]  JUNEL FE 1/20 1-20 MG-MCG tablet TK 1 T PO QD. PATIENT NEEDS TO SCHEDULE ANNUAL APPOINTMENT PRIOR TO FURTHER REFILLS. [facility]IC SUBSTITUTE FOR MICROGESTIN FE. 08/09/17   [provider]  methylPREDNISolone (MEDROL DOSEPAK) 4 MG TBPK tablet  10/06/17   [provider]  omeprazole (PRILOSEC) 20 MG capsule Take 1 capsule (20 mg total) by mouth daily. Patient not taking: Reported on 12/30/2017 10/01/17   Shaune Pollack, MD    Family History No family history on file.  Social History Social History   Tobacco Use  . Smoking status: Never Smoker  . Smokeless tobacco: Never Used  Substance Use Topics  . Alcohol use: No  . Drug use: No     Allergies   Patient has no known allergies.   Review of Systems Review of Systems  Reason unable to perform ROS: See HPI as above.     Physical Exam Triage Vital Signs ED Triage Vitals  Enc Vitals Group     BP 12/30/17 1444 116/67     Pulse Rate 12/30/17 1442 74     Resp 12/30/17 1442 18     Temp 12/30/17 1442 98.2 F (36.8 C)     Temp src --      SpO2 12/30/17  1442 100 %     Weight --      Height --      Head Circumference --      Peak Flow --      Pain Score 12/30/17 1444 0     Pain Loc --      Pain Edu? --      Excl. in GC? --    No data found.  Updated Vital Signs BP 116/67   Pulse 74   Temp 98.2 F (36.8 C)   Resp 18   LMP 12/27/2017   SpO2 100%   Physical Exam  Constitutional: She is oriented to person, place, and time. She appears well-developed and well-nourished. No distress.  HENT:  Head: Normocephalic and atraumatic.  Eyes: Conjunctivae are normal. Pupils are equal, round, and reactive to light.  Neurological: She is alert and oriented to person, place, and time.  Skin: No pallor.  Multiple small  bruising along bilateral inner thighs. No swelling, erythema, increased warmth.     UC Treatments / Results  Labs (all labs ordered are listed, but only abnormal results are displayed) Labs Reviewed  COMPREHENSIVE METABOLIC PANEL - Abnormal; Notable for the following components:      Result Value   Glucose, Bld 107 (*)    All other components within normal limits  POCT I-STAT, CHEM 8 - Abnormal; Notable for the following components:   Glucose, Bld 104 (*)    All other components within normal limits  CBC WITH DIFFERENTIAL/PLATELET  PROTIME-INR    EKG  EKG Interpretation None       Radiology No results found.  Procedures Procedures (including critical care time)  Medications Ordered in UC Medications - No data to display   Initial Impression / Assessment and Plan / UC Course  I have reviewed the triage vital signs and the nursing notes.  Pertinent labs & imaging results that were available during my care of the patient were reviewed by me and considered in my medical decision making (see chart for details).    istat with normal hemoglobin. Patient stable without dizziness, weakness, syncope. Was able to ambulate on own without problems. Will discharge in stable condition awaiting CBC, pro time-INR, CMP. Patient to discontinue Cymbalta for now. To follow up with PCP/rheumatologist for further evaluation and management needed. Return precautions given. Patient expresses understanding and agrees to plan.   CBC, pro time-INR, CMP within normal limits. Patient to discontinue Cymbalta and to contact prescribing provider for further instructions.  Final Clinical Impressions(s) / UC Diagnoses   Final diagnoses:  Abnormal bruising    ED Discharge Orders    None        Belinda FisherYu, Amy V, PA-C 12/30/17 1628

## 2017-12-30 NOTE — ED Triage Notes (Signed)
Pt has been on Cymbalta x1 month, pt states "I doubled up on it a few weeks ago for takayasus arthritis." pt c/o severe bruising on the inside of her legs. Pt states her period has been very heavy. Pt called her rheumatologist about it and was worried about having a reaction.

## 2018-02-09 DIAGNOSIS — M25532 Pain in left wrist: Secondary | ICD-10-CM | POA: Diagnosis not present

## 2018-02-11 ENCOUNTER — Other Ambulatory Visit (INDEPENDENT_AMBULATORY_CARE_PROVIDER_SITE_OTHER): Payer: Self-pay | Admitting: Physical Medicine and Rehabilitation

## 2018-02-11 NOTE — Telephone Encounter (Signed)
Please advise 

## 2018-02-17 DIAGNOSIS — S63502A Unspecified sprain of left wrist, initial encounter: Secondary | ICD-10-CM | POA: Diagnosis not present

## 2018-03-06 ENCOUNTER — Encounter (HOSPITAL_COMMUNITY): Payer: Self-pay

## 2018-03-06 ENCOUNTER — Ambulatory Visit (HOSPITAL_COMMUNITY)
Admission: AD | Admit: 2018-03-06 | Discharge: 2018-03-06 | Disposition: A | Discharge status: Home / Self Care | Payer: BLUE CROSS/BLUE SHIELD | Source: Home / Self Care | Attending: Psychiatry | Admitting: Psychiatry

## 2018-03-06 ENCOUNTER — Emergency Department (HOSPITAL_COMMUNITY)
Admission: EM | Admit: 2018-03-06 | Discharge: 2018-03-07 | Disposition: A | Discharge status: Home / Self Care | Payer: BLUE CROSS/BLUE SHIELD | Attending: Emergency Medicine | Admitting: Emergency Medicine

## 2018-03-06 ENCOUNTER — Other Ambulatory Visit: Payer: Self-pay

## 2018-03-06 DIAGNOSIS — Z79899 Other long term (current) drug therapy: Secondary | ICD-10-CM | POA: Insufficient documentation

## 2018-03-06 DIAGNOSIS — F411 Generalized anxiety disorder: Secondary | ICD-10-CM

## 2018-03-06 DIAGNOSIS — F329 Major depressive disorder, single episode, unspecified: Secondary | ICD-10-CM

## 2018-03-06 DIAGNOSIS — F419 Anxiety disorder, unspecified: Secondary | ICD-10-CM | POA: Diagnosis present

## 2018-03-06 DIAGNOSIS — R450 Nervousness: Secondary | ICD-10-CM | POA: Diagnosis not present

## 2018-03-06 DIAGNOSIS — F32A Depression, unspecified: Secondary | ICD-10-CM

## 2018-03-06 DIAGNOSIS — F332 Major depressive disorder, recurrent severe without psychotic features: Secondary | ICD-10-CM

## 2018-03-06 DIAGNOSIS — F418 Other specified anxiety disorders: Secondary | ICD-10-CM | POA: Diagnosis not present

## 2018-03-06 DIAGNOSIS — R45851 Suicidal ideations: Secondary | ICD-10-CM | POA: Diagnosis not present

## 2018-03-06 DIAGNOSIS — G4700 Insomnia, unspecified: Secondary | ICD-10-CM | POA: Diagnosis not present

## 2018-03-06 LAB — COMPREHENSIVE METABOLIC PANEL
ALT: 15 U/L (ref 14–54)
ANION GAP: 12 (ref 5–15)
AST: 18 U/L (ref 15–41)
Albumin: 4.4 g/dL (ref 3.5–5.0)
Alkaline Phosphatase: 48 U/L (ref 38–126)
BUN: 11 mg/dL (ref 6–20)
CHLORIDE: 106 mmol/L (ref 101–111)
CO2: 20 mmol/L — ABNORMAL LOW (ref 22–32)
CREATININE: 0.79 mg/dL (ref 0.44–1.00)
Calcium: 9.2 mg/dL (ref 8.9–10.3)
Glucose, Bld: 88 mg/dL (ref 65–99)
Potassium: 3.4 mmol/L — ABNORMAL LOW (ref 3.5–5.1)
SODIUM: 138 mmol/L (ref 135–145)
Total Bilirubin: 0.6 mg/dL (ref 0.3–1.2)
Total Protein: 7.5 g/dL (ref 6.5–8.1)

## 2018-03-06 LAB — CBC
HCT: 40.4 % (ref 36.0–46.0)
Hemoglobin: 13.4 g/dL (ref 12.0–15.0)
MCH: 29.3 pg (ref 26.0–34.0)
MCHC: 33.2 g/dL (ref 30.0–36.0)
MCV: 88.2 fL (ref 78.0–100.0)
Platelets: 312 10*3/uL (ref 150–400)
RBC: 4.58 MIL/uL (ref 3.87–5.11)
RDW: 12.2 % (ref 11.5–15.5)
WBC: 10.3 10*3/uL (ref 4.0–10.5)

## 2018-03-06 LAB — RAPID URINE DRUG SCREEN, HOSP PERFORMED
AMPHETAMINES: NOT DETECTED
Barbiturates: NOT DETECTED
Benzodiazepines: NOT DETECTED
COCAINE: NOT DETECTED
OPIATES: NOT DETECTED
Tetrahydrocannabinol: NOT DETECTED

## 2018-03-06 LAB — I-STAT BETA HCG BLOOD, ED (MC, WL, AP ONLY): I-stat hCG, quantitative: <5 m[IU]/mL (ref <5)

## 2018-03-06 LAB — SALICYLATE LEVEL: Salicylate Lvl: <7 mg/dL (ref 2.8–30.0)

## 2018-03-06 LAB — ACETAMINOPHEN LEVEL

## 2018-03-06 LAB — ETHANOL

## 2018-03-06 NOTE — [facility] Counselor (Addendum)
At 8:18pm, clinican spoke to Misty StanleyLisa, Press photographerCharge Nurse to express [facility]. [facility], pt can come tomorrow (03/07/2018) after 1pm. TTS to coordinate with day shift AC on pt's bed assignment.  Redmond Pullingreylese D Immanuel Fedak, MS, [facility], [facility] Triage Specialist [phone]

## 2018-03-06 NOTE — ED Notes (Signed)
Bed: [id number] Expected date: 03/07/18 Expected time: 7:00 AM Means of arrival:  Comments:

## 2018-03-06 NOTE — H&P (Signed)
Behavioral Health Medical Screening Exam  Kelsey Myers is an 22 y.o. female.  Total Time spent with patient: 20 minutes  Psychiatric Specialty Exam: Physical Exam  Constitutional: She is oriented to person, place, and time. She appears well-developed and well-nourished. No distress.  HENT:  Head: Normocephalic and atraumatic.  Right Ear: External ear normal.  Left Ear: External ear normal.  Eyes: Conjunctivae are normal. Right eye exhibits no discharge. Left eye exhibits no discharge. No scleral icterus.  Cardiovascular: Normal rate.  Respiratory: Effort normal. No respiratory distress.  Musculoskeletal: Normal range of motion.  Neurological: She is alert and oriented to person, place, and time.  Skin: Skin is warm and dry. She is not diaphoretic.  Psychiatric: Her mood appears anxious. She is not slowed, not withdrawn and not actively hallucinating. Thought content is not paranoid and not delusional. Cognition and memory are normal. She expresses impulsivity and inappropriate judgment. She exhibits a depressed mood. She expresses suicidal ideation. She expresses no homicidal ideation. She expresses suicidal plans.    Review of Systems  Constitutional: Negative for chills, fever and weight loss.  Respiratory: Negative for shortness of breath.   Cardiovascular: Negative for chest pain.  Psychiatric/Behavioral: Positive for depression and suicidal ideas. Negative for hallucinations, memory loss and substance abuse. The patient is nervous/anxious and has insomnia.     Blood pressure 126/76, pulse 82, temperature 97.9 F (36.6 C), resp. rate 20, SpO2 99 %.There is no height or weight on file to calculate BMI.  General Appearance: Casual and Well Groomed  Eye Contact:  Fair  Speech:  Clear and Coherent and Normal Rate  Volume:  Normal  Mood:  Anxious, Depressed, Dysphoric, Hopeless and Worthless  Affect:  Congruent and Depressed  Thought Process:  Coherent, Goal Directed and  Descriptions of Associations: Intact  Orientation:  Full (Time, Place, and Person)  Thought Content:  Logical and Hallucinations: None  Suicidal Thoughts:  Yes.  with intent/plan  Homicidal Thoughts:  No  Memory:  Immediate;   Good Recent;   Good  Judgement:  Impaired  Insight:  Lacking  Psychomotor Activity:  Increased and Restlessness  Concentration: Concentration: Fair and Attention Span: Fair  Recall:  Good  Fund of Knowledge:Good  Language: Good  Akathisia:  No  Handed:  Right  AIMS (if indicated):     Assets:  Communication Skills Desire for Improvement Financial Resources/Insurance Housing Intimacy Leisure Time Physical Health Transportation  Sleep:       Musculoskeletal: Strength & Muscle Tone: within normal limits Gait & Station: normal   Blood pressure 126/76, pulse 82, temperature 97.9 F (36.6 C), resp. rate 20, SpO2 99 %.  Recommendations:  Based on my evaluation the patient does not appear to have an emergency medical condition.  Recommend psychiatric Inpatient admission when medically cleared.  Due to patient's medical history will send for medical clearance. [facility] after 1 pm on 03/07/18.  Jackelyn PolingJason A Nivek Powley, NP 03/06/2018, 9:18 PM

## 2018-03-06 NOTE — ED Provider Notes (Signed)
[facility]-EMERGENCY DEPT Provider Note   CSN: [id number] Arrival date & time: 03/06/18  2039     History   Chief Complaint Chief Complaint  Patient presents with  . Suicidal    HPI Kelsey Myers is a 22 y.o. female.  Patient c/o feeling very anxious and depressed in past several weeks. Symptoms progressive, mod-severe, slowly getting worse. States cant sleep at night, decreased appetite, few lb weight loss. Denies being on meds for same in past. Denies substance abuse. States a lot of stress with school. Thoughts about overdosing on medication.   The history is provided by the patient.    Past Medical History:  Diagnosis Date  . Takayasu's arteritis [facility])     Patient Active Problem List   Diagnosis Date Noted  . Chronic right-sided low back pain without sciatica 10/20/2017  . Pain of upper abdomen 10/20/2017  . Takayasu's arteritis (HCC) 10/20/2017    History reviewed. No pertinent surgical history.   OB History   None      Home Medications    Prior to Admission medications   Medication Sig Start Date End Date Taking? Authorizing Provider  ibuprofen (ADVIL,MOTRIN) 200 MG tablet Take 600 mg by mouth daily as needed for moderate pain.    Yes [provider]  norethindrone (MICRONOR,CAMILA,ERRIN) 0.35 MG tablet Take 1 tablet by mouth daily.  12/24/14  Yes [provider]  tiZANidine (ZANAFLEX) 4 MG tablet TAKE 1 TABLET BY MOUTH EVERY NIGHT AT BEDTIME 02/11/18  Yes Tyrell Antonio, MD  JUNEL FE 1/20 1-20 MG-MCG tablet TK 1 T PO QD. PATIENT NEEDS TO SCHEDULE ANNUAL APPOINTMENT PRIOR TO FURTHER REFILLS. GENERIC SUBSTITUTE FOR MICROGESTIN FE. 08/09/17   [provider]  omeprazole (PRILOSEC) 20 MG capsule Take 1 capsule (20 mg total) by mouth daily. Patient not taking: Reported on 12/30/2017 10/01/17   Shaune Pollack, MD  ondansetron (ZOFRAN) 4 MG tablet Take 1 tablet (4 mg total) by mouth every 6 (six) hours. Patient not  taking: Reported on 03/06/2018 12/27/17   Gilda Crease, MD  oxyCODONE-acetaminophen (PERCOCET) 5-325 MG tablet Take 1 tablet by mouth every 4 (four) hours as needed. Patient not taking: Reported on 03/06/2018 12/27/17   Gilda Crease, MD    Family History History reviewed. No pertinent family history.  Social History Social History   Tobacco Use  . Smoking status: Never Smoker  . Smokeless tobacco: Never Used  Substance Use Topics  . Alcohol use: No  . Drug use: No     Allergies   Patient has no known allergies.   Review of Systems Review of Systems  Constitutional: Negative for fever.  HENT: Negative for sore throat.   Eyes: Negative for redness.  Respiratory: Negative for shortness of breath.   Cardiovascular: Negative for chest pain.  Gastrointestinal: Negative for abdominal pain.  Genitourinary: Negative for flank pain.  Musculoskeletal: Negative for neck pain.  Skin: Negative for rash.  Neurological: Negative for headaches.  Hematological: Does not bruise/bleed easily.  Psychiatric/Behavioral: Positive for dysphoric mood.     Physical Exam Updated Vital Signs BP 119/86   Pulse 97   Temp 97.7 F (36.5 C) (Oral)   Resp 16   Ht 1.549 m (5\' 1" )   Wt 48.5 kg (107 lb)   SpO2 96%   BMI 20.22 kg/m   Physical Exam  Constitutional: She appears well-developed and well-nourished. No distress.  HENT:  Head: Atraumatic.  Mouth/Throat: Oropharynx is clear and moist.  Eyes: Conjunctivae  are normal. No scleral icterus.  Neck: Neck supple. No tracheal deviation present.  Cardiovascular: Normal rate, regular rhythm, normal heart sounds and intact distal pulses.  Pulmonary/Chest: Effort normal and breath sounds normal. No respiratory distress.  Abdominal: Soft. Normal appearance. She exhibits no distension. There is no tenderness.  Musculoskeletal: She exhibits no edema.  Neurological: She is alert.  Steady gait.   Skin: Skin is warm and dry. No rash  noted.  Psychiatric:  Depressed mood/flat affect.   Nursing note and vitals reviewed.    ED Treatments / Results  Labs (all labs ordered are listed, but only abnormal results are displayed) Results for orders placed or performed during the hospital encounter of 03/06/18  Comprehensive metabolic panel  Result Value Ref Range   Sodium 138 135 - 145 mmol/L   Potassium 3.4 (L) 3.5 - 5.1 mmol/L   Chloride 106 101 - 111 mmol/L   CO2 20 (L) 22 - 32 mmol/L   Glucose, Bld 88 65 - 99 mg/dL   BUN 11 6 - 20 mg/dL   Creatinine, Ser 1.610.79 0.44 - 1.00 mg/dL   Calcium 9.2 8.9 - 09.610.3 mg/dL   Total Protein 7.5 6.5 - 8.1 g/dL   Albumin 4.4 3.5 - 5.0 g/dL   AST 18 15 - 41 U/L   ALT 15 14 - 54 U/L   Alkaline Phosphatase 48 38 - 126 U/L   Total Bilirubin 0.6 0.3 - 1.2 mg/dL   GFR calc non Af Amer >60 >60 mL/min   GFR calc Af Amer >60 >60 mL/min   Anion gap 12 5 - 15  Ethanol  Result Value Ref Range   Alcohol, Ethyl (B) <10 <10 mg/dL  Salicylate level  Result Value Ref Range   Salicylate Lvl <7.0 2.8 - 30.0 mg/dL  Acetaminophen level  Result Value Ref Range   Acetaminophen (Tylenol), Serum <10 (L) 10 - 30 ug/mL  cbc  Result Value Ref Range   WBC 10.3 4.0 - 10.5 K/uL   RBC 4.58 3.87 - 5.11 MIL/uL   Hemoglobin 13.4 12.0 - 15.0 g/dL   HCT 04.540.4 40.936.0 - 81.146.0 %   MCV 88.2 78.0 - 100.0 fL   MCH 29.3 26.0 - 34.0 pg   MCHC 33.2 30.0 - 36.0 g/dL   RDW 91.412.2 78.211.5 - 95.615.5 %   Platelets 312 150 - 400 K/uL  Rapid urine drug screen (hospital performed)  Result Value Ref Range   Opiates NONE DETECTED NONE DETECTED   Cocaine NONE DETECTED NONE DETECTED   Benzodiazepines NONE DETECTED NONE DETECTED   Amphetamines NONE DETECTED NONE DETECTED   Tetrahydrocannabinol NONE DETECTED NONE DETECTED   Barbiturates NONE DETECTED NONE DETECTED  I-Stat beta hCG blood, ED  Result Value Ref Range   I-stat hCG, quantitative <5.0 <5 mIU/mL   Comment 3            EKG None  Radiology No results  found.  Procedures Procedures (including critical care time)  Medications Ordered in ED Medications - No data to display   Initial Impression / Assessment and Plan / ED Course  I have reviewed the triage vital signs and the nursing notes.  Pertinent labs & imaging results that were available during my care of the patient were reviewed by me and considered in my medical decision making (see chart for details).  Labs sent.   BH team consulted.   Reviewed nursing notes and prior charts for additional history.   Labs reviewed, k sl low, otherwise  unremarkable.   BH eval pending. Feel likely will benefit from inpatient psych tx.   BH eval pending - disposition per [facility] team.     Final Clinical Impressions(s) / ED Diagnoses   Final diagnoses:  None    ED Discharge Orders    None       Cathren Laine, MD 03/06/18 2249

## 2018-03-06 NOTE — BH Assessment (Addendum)
Assessment Note  Kelsey Myers is an 22 y.o. female, who presents voluntary and unaccompanied to [facility]. Clinician asked the pt, "what brought you to the hospital?" Pt reported, calling the Suicide Hotline and it was recommended she come. 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 Pt reported, the stress from school, her health, family issues and the recent death of a friend is increasing her depression and anxiety. Pt reported, having suicidal ideations that come and goes for the past six years however the ideations have been constant over the past four months. Pt reported, she was diagnosed with Takayasu's Arteritis (inflammation of blood vessels.) Pt reported, she was hospitalized from 200 days from 2014-2017 and it went back into remission. Pt reported, the severe pain has returned to her stomach and back. Pt reported, her doctors and rheumatologist have given up on her. Pt reported, when stressed her pain level increases. Pt reported, having a panic attach four to five times daily for the past three months.  Pt reported, cutting her arms and legs with anything she can get. Pt denies, HI, AVH and access to weapons.   Pt denies abuse and substance use. Pt's UDS is pending. Pt reported, being linked to counseling in the past. Pt denies, previous inpatient admissions.   Pt presents quiet/awake with soft speech. Pt's eye contact. Pt's mood was depressed/anxious. Pt's thought process was coherent/relevant. Pt's judgement was unimpaired. Pt was oriented x4. Pt's concentration was normal. Pt's insight was fair. Pt's impulse control was poor. Pt reported, if discharged from [facility] she could contract for safety. Pt reported, if inpatient treatment was recommended she will sign-in voluntarily.   Diagnosis: F33.2 Major depressive Disorder, recurrent, severe without psychotic features.                     F41.1 Generalized Anxiety Disorder.  Past  Medical History:  Past Medical History:  Diagnosis Date  . Takayasu's arteritis (HCC)     No past surgical history on file.  Family History: No family history on file.  Social History:  reports that she has never smoked. She has never used smokeless tobacco. She reports that she does not drink alcohol or use drugs.  Additional Social History:  Alcohol / Drug Use Pain Medications: See MAR Prescriptions: See MAR Over the Counter: See MAR History of alcohol / drug use?: No history of alcohol / drug abuse(Pt denies.)  CIWA:   COWS:    Allergies: No Known Allergies  Home Medications:  (Not in a hospital admission)  OB/GYN Status:  No LMP recorded.  General Assessment Data Location of Assessment: [facility] Services TTS Assessment: In system Is this a Tele or Face-to-Face Assessment?: Face-to-Face Is this an Initial Assessment or a Re-assessment for this encounter?: Initial Assessment Marital status: Single Living Arrangements: Other (Comment)(with five room mates on campus. ) Can pt return to current living arrangement?: Yes Admission Status: Voluntary Is patient capable of signing voluntary admission?: Yes Referral Source: Self/Family/Friend Insurance type: BCBS.     Crisis Care Plan Living Arrangements: Other (Comment)(with five room mates on campus. ) Legal Guardian: Other:(Self.) Name of Psychiatrist: NA Name of Therapist: NA  Education Status Is patient currently in school?: Yes Current Grade: Sophomore in college.  Highest grade of school patient has completed: Freshman in college.  Name of school: [employer]. Contact person: NA  Risk to self with the past 6 months Suicidal Ideation: Yes-Currently Present Has patient  been a risk to self within the past 6 months prior to admission? : Yes Suicidal Intent: No Has patient had any suicidal intent within the past 6 months prior to admission? : No Is patient at risk for suicide?: Yes Suicidal Plan?: No Has  patient had any suicidal plan within the past 6 months prior to admission? : No Access to Means: Yes Specify Access to Suicidal Means: Pt reported, she cuts her arms and legs with anything she can.  What has been your use of drugs/alcohol within the last 12 months?: Pt denies.  Previous Attempts/Gestures: No How many times?: 0 Other Self Harm Risks: Cutting.  Triggers for Past Attempts: None known Intentional Self Injurious Behavior: Cutting Comment - Self Injurious Behavior: Pt reported, she cuts her arms and legs with anything she can.  Family Suicide History: No Recent stressful life event(s): Conflict (Comment), Loss (Comment), Other (Comment)(family/health issues, full time student, full time job. ) Persecutory voices/beliefs?: No Depression: Yes Depression Symptoms: Feeling angry/irritable, Feeling worthless/self pity, Loss of interest in usual pleasures, Guilt, Fatigue, Isolating, Tearfulness, Insomnia, Despondent Substance abuse history and/or treatment for substance abuse?: No Suicide prevention information given to non-admitted patients: Not applicable  Risk to Others within the past 6 months Homicidal Ideation: No(Pt denies. ) Does patient have any lifetime risk of violence toward others beyond the six months prior to admission? : No Thoughts of Harm to Others: No Current Homicidal Intent: No Current Homicidal Plan: No Access to Homicidal Means: No(Pt denies. ) Describe Access to Homicidal Means: NA Identified Victim: NA History of harm to others?: No(Pt denies. ) Assessment of Violence: None Noted Violent Behavior Description: NA Does patient have access to weapons?: No(Pt denies. ) Criminal Charges Pending?: No Does patient have a court date: No Is patient on probation?: No  Psychosis Hallucinations: None noted Delusions: None noted  Mental Status Report Appearance/Hygiene: Unremarkable Eye Contact: Fair Motor Activity: Unremarkable Speech: Soft Level of  Consciousness: Quiet/awake Mood: Depressed, Anxious Affect: Other (Comment)(congruent with mood.) Anxiety Level: Panic Attacks Panic attack frequency: Pt reported, 4-5 time over the past three months.  Most recent panic attack: Pt reported, today.  Thought Processes: Coherent, Relevant Judgement: Unimpaired Orientation: Person, Place, Time, Situation Obsessive Compulsive Thoughts/Behaviors: None  Cognitive Functioning Concentration: Normal Memory: Recent Intact Is patient IDD: No Is patient DD?: No Insight: Fair Impulse Control: Poor Appetite: Poor Have you had any weight changes? : Loss Amount of the weight change? (lbs): (Pt reported, lost 10 pounds in three months. ) Sleep: Decreased Total Hours of Sleep: 2 Vegetative Symptoms: Staying in bed  ADLScreening [facility] Assessment Services) Patient's cognitive ability adequate to safely complete daily activities?: Yes Patient able to express need for assistance with ADLs?: Yes Independently performs ADLs?: Yes (appropriate for developmental age)  Prior Inpatient Therapy Prior Inpatient Therapy: No  Prior Outpatient Therapy Prior Outpatient Therapy: Yes Prior Therapy Dates: Pt reportd, from 2014-2015. Prior Therapy Facilty/Provider(s): [facility] Reason for Treatment: Counseling.  Does patient have an ACCT team?: No Does patient have Intensive In-House Services?  : No Does patient have [facility] services? : No Does patient have P4CC services?: No  ADL Screening (condition at time of admission) Patient's cognitive ability adequate to safely complete daily activities?: Yes Is the patient deaf or have difficulty hearing?: No Does the patient have difficulty seeing, even when wearing glasses/contacts?: Yes(Pt wears glasses. ) Does the patient have difficulty concentrating, remembering, or making decisions?: Yes Patient able to express need for assistance with ADLs?: Yes Does  the patient have difficulty dressing or bathing?:  No Independently performs ADLs?: Yes (appropriate for developmental age) Does the patient have difficulty walking or climbing stairs?: No Weakness of Legs: None Weakness of Arms/Hands: None  Home Assistive Devices/Equipment Home Assistive Devices/Equipment: Eyeglasses    Abuse/Neglect Assessment (Assessment to be complete while patient is alone) Abuse/Neglect Assessment Can Be Completed: Yes Physical Abuse: Denies(Pt denies. ) Verbal Abuse: Denies(Pt denies. ) Sexual Abuse: Denies(Pt denies. ) Exploitation of patient/patient's resources: Denies(Pt denies. ) Self-Neglect: Denies(Pt denies.)     Advance Directives (For Healthcare) Does Patient Have a Medical Advance Directive?: No    Additional Information 1:1 In Past 12 Months?: No CIRT Risk: No Elopement Risk: No Does patient have medical clearance?: No     Disposition: Pt has a bed at [facility], pt can come tomorrow (03/07/2018) after 1pm. TTS to coordinate with day shift AC on pt's bed assignment.    Disposition Initial Assessment Completed for this Encounter: Yes Disposition of Patient: Movement to [facility] or [facility] ED(Pt tranfered to [facility] for medical clearance. ) Patient refused recommended treatment: No Mode of transportation if patient is discharged?: Bus  On Site Evaluation by:  [place]. Jake Fuhrmann, MS, LPC, CRC. Reviewed with Physician:  Nira Conn, NP.  Redmond Pulling 03/06/2018 8:33 PM   Redmond Pulling, MS, [facility], [facility] Triage Specialist [phone].

## 2018-03-06 NOTE — ED Triage Notes (Signed)
States had been thinking about hurting self for about a year states now thinks about taking overdose of medication she has, and stomach pain but has not eaten today.

## 2018-03-06 NOTE — ED Notes (Signed)
Bed: WA28 Expected date:  Expected time:  Means of arrival:  Comments: Triage 5 

## 2018-03-07 ENCOUNTER — Inpatient Hospital Stay (HOSPITAL_COMMUNITY): Admission: AD | Admit: 2018-03-07 | Payer: BLUE CROSS/BLUE SHIELD | Source: Intra-hospital | Admitting: Psychiatry

## 2018-03-07 DIAGNOSIS — G4700 Insomnia, unspecified: Secondary | ICD-10-CM | POA: Diagnosis not present

## 2018-03-07 DIAGNOSIS — F419 Anxiety disorder, unspecified: Secondary | ICD-10-CM | POA: Diagnosis not present

## 2018-03-07 DIAGNOSIS — R450 Nervousness: Secondary | ICD-10-CM

## 2018-03-07 NOTE — [facility] Suicide Risk Assessment (Signed)
Suicide Risk Assessment  Discharge Assessment   [facility]   Principal Problem: Anxiety disorder, unspecified Discharge Diagnoses:  Patient Active Problem List   Diagnosis Date Noted  . Anxiety disorder, unspecified [F41.9] 03/07/2018  . Chronic right-sided low back pain without sciatica [M54.5, G89.29] 10/20/2017  . Pain of upper abdomen [R10.10] 10/20/2017  . Takayasu's arteritis (HCC) [M31.4] 10/20/2017    Total Time spent with patient: 45 minutes  Musculoskeletal: Strength & Muscle Tone: within normal limits Gait & Station: normal Patient leans: N/A   Psychiatric Specialty Exam: Physical Exam ROS Blood pressure (!) 91/55, pulse 78, temperature 98.2 F (36.8 C), temperature source Oral, resp. rate 16, height 5\' 1"  (1.549 m), weight 48.5 kg (107 lb), SpO2 100 %.Body mass index is 20.22 kg/m. General Appearance: Casual Eye Contact:  Good Speech:  Clear and Coherent and Normal Rate Volume:  Normal Mood:  Anxious and Depressed Affect:  Congruent and Depressed Thought Process:  Coherent, Goal Directed and Linear Orientation:  Full (Time, Place, and Person) Thought Content:  Logical Suicidal Thoughts:  No Homicidal Thoughts:  No Memory:  Immediate;   Good Recent;   Good Remote;   Fair Judgement:  Fair Insight:  Fair Psychomotor Activity:  Normal Concentration:  Concentration: Good and Attention Span: Good Recall:  Good Fund of Knowledge:  Good Language:  Good Akathisia:  No Handed:  Right AIMS (if indicated):    Assets:  Communication Skills Desire for Improvement Financial Resources/Insurance Housing Social Support ADL's:  Intact Cognition:  WNL   Mental Status Per Nursing Assessment::   On Admission:   Anxious and depressed  Demographic Factors:  Adolescent or young adult  Loss Factors: Decline in physical health  Historical Factors: Impulsivity  Risk Reduction Factors:   Sense of responsibility to family, Employed  and Living with another person, especially a relative  Continued Clinical Symptoms:  Severe Anxiety and/or Agitation Depression:   Impulsivity Chronic Pain  Cognitive Features That Contribute To Risk:  Closed-mindedness    Suicide Risk:  Minimal: No identifiable suicidal ideation.  Patients presenting with no risk factors but with morbid ruminations; may be classified as minimal risk based on the severity of the depressive symptoms    Plan Of Care/Follow-up recommendations:  Activity:  as tolerated Diet:  Heart Healthy  Laveda AbbeLaurie Britton Parks, NP 03/07/2018, 11:49 AM

## 2018-03-07 NOTE — Discharge Instructions (Addendum)
For your behavioral health needs, you are advised to follow up with the [facility].  They offer both psychiatry/medication management, and therapy.  Contact them at your earliest opportunity to ask about scheduling an intake appointment:       Counseling Center      [facility]      [facility] [place]      [facility]      [address]      [place], [place] [address]      [phone]  Other providers in the community that offer both psychiatry and therapy are listed below:       [facility]      [address]      [place], [place] [address]      [phone]       [facility]      [address]      [place], [place] [address]      [phone]       [facility]      [phone] [address]      [place], [place] [address]      [phone]

## 2018-03-07 NOTE — ED Notes (Signed)
Two patient belonging bags one black purse and 2 pairs stub earring in locker 28

## 2018-03-07 NOTE — Consult Note (Addendum)
[facility] Consult   Reason for Consult:  Anxiety and depression Referring Physician:  EDP Patient Identification: Kelsey Myers MRN:  [id number] Principal Diagnosis: Anxiety disorder, unspecified Diagnosis:   Patient Active Problem List   Diagnosis Date Noted  . Anxiety disorder, unspecified [F41.9] 03/07/2018  . Chronic right-sided low back pain without sciatica [M54.5, G89.29] 10/20/2017  . Pain of upper abdomen [R10.10] 10/20/2017  . 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 female patient admitted with anxiety and depression.  HPI:  Pt was seen and chart reviewed with treatment team and Dr Mariea Clonts. Pt denies suicidal/homicidal ideation, denies auditory/visual hallucinations and does not appear to be responding to internal stimuli. Pt stated she became overwhelmed yesterday and called the suicide hotline for help. Pt is a Electronics engineer with a paid internship and also has some ongoing health issues. Pt lives on campus with 5 roommates and stated she feels safe to return to her dorm. Pt stated she has not been sleeping well and she wants resources for outpatient providers so she can get her sleep sorted out. Pt stated she feels the lack of sleep is what caused her to become so overwhelmed. Pt is psychiatrically clear for discharge.   Past Psychiatric History: As above  Risk to Self: None Risk to Others:None  Prior Inpatient Therapy:  None Prior Outpatient Therapy:  None  Past Medical History:  Past Medical History:  Diagnosis Date  . Takayasu's arteritis ([place])    History reviewed. No pertinent surgical history. Family History: History reviewed. No pertinent family history. Family Psychiatric  History: Unknown since patient is adopted. Social History:  Social History   Substance and Sexual Activity  Alcohol Use No     Social History   Substance and Sexual  Activity  Drug Use No    Social History   Socioeconomic History  . Marital status: Single    Spouse name: Not on file  . Number of children: Not on file  . Years of education: Not on file  . Highest education level: Not on file  Occupational History  . Not on file  Social Needs  . Financial resource strain: Not on file  . Food insecurity:    Worry: Not on file    Inability: Not on file  . Transportation needs:    Medical: Not on file    Non-medical: Not on file  Tobacco Use  . Smoking status: Never Smoker  . Smokeless tobacco: Never Used  Substance and Sexual Activity  . Alcohol use: No  . Drug use: No  . Sexual activity: Not on file  Lifestyle  . Physical activity:    Days per week: Not on file    Minutes per session: Not on file  . Stress: Not on file  Relationships  . Social connections:    Talks on phone: Not on file    Gets together: Not on file    Attends religious service: Not on file    Active member of club or organization: Not on file    Attends meetings of clubs or organizations: Not on file    Relationship status: Not on file  Other Topics Concern  . Not on file  Social History Narrative  . Not on file   Additional Social History: N/A    Allergies:  No Known Allergies  Labs:  Results for orders placed or performed during the  hospital encounter of 03/06/18 (from the past 48 hour(s))  Comprehensive metabolic panel     Status: Abnormal   Collection Time: 03/06/18  9:13 PM  Result Value Ref Range   Sodium 138 135 - 145 mmol/L   Potassium 3.4 (L) 3.5 - 5.1 mmol/L   Chloride 106 101 - 111 mmol/L   CO2 20 (L) 22 - 32 mmol/L   Glucose, Bld 88 65 - 99 mg/dL   BUN 11 6 - 20 mg/dL   Creatinine, Ser 0.79 0.44 - 1.00 mg/dL   Calcium 9.2 8.9 - 10.3 mg/dL   Total Protein 7.5 6.5 - 8.1 g/dL   Albumin 4.4 3.5 - 5.0 g/dL   AST 18 15 - 41 U/L   ALT 15 14 - 54 U/L   Alkaline Phosphatase 48 38 - 126 U/L   Total Bilirubin 0.6 0.3 - 1.2 mg/dL   GFR calc non  Af Amer >60 >60 mL/min   GFR calc Af Amer >60 >60 mL/min    Comment: (NOTE) The eGFR has been calculated using the CKD EPI equation. This calculation has not been validated in all clinical situations. eGFR's persistently <60 mL/min signify possible Chronic Kidney Disease.    Anion gap 12 5 - 15    Comment: Performed at [facility], [place] [address], [place], [place] [address]  Ethanol     Status: None   Collection Time: 03/06/18  9:13 PM  Result Value Ref Range   Alcohol, Ethyl (B) <10 <10 mg/dL    Comment:        LOWEST DETECTABLE LIMIT FOR SERUM ALCOHOL IS 10 mg/dL FOR MEDICAL PURPOSES ONLY Performed at [facility] [address] [place], [place] [address]   Salicylate level     Status: None   Collection Time: 03/06/18  9:13 PM  Result Value Ref Range   Salicylate Lvl <8.9 2.8 - 30.0 mg/dL    Comment: Performed at [facility], [place] [address] [place], [place] [address]  Acetaminophen level     Status: Abnormal   Collection Time: 03/06/18  9:13 PM  Result Value Ref Range   Acetaminophen (Tylenol), Serum <10 (L) 10 - 30 ug/mL    Comment:        THERAPEUTIC CONCENTRATIONS VARY SIGNIFICANTLY. A RANGE OF 10-30 ug/mL MAY BE AN EFFECTIVE CONCENTRATION FOR MANY PATIENTS. HOWEVER, SOME ARE BEST TREATED AT CONCENTRATIONS OUTSIDE THIS RANGE. ACETAMINOPHEN CONCENTRATIONS >150 ug/mL AT 4 HOURS AFTER INGESTION AND >50 ug/mL AT 12 HOURS AFTER INGESTION ARE OFTEN ASSOCIATED WITH TOXIC REACTIONS. Performed at [facility], [place] [address], [place], [place] [address]   cbc     Status: None   Collection Time: 03/06/18  9:13 PM  Result Value Ref Range   WBC 10.3 4.0 - 10.5 K/uL   RBC 4.58 3.87 - 5.11 MIL/uL   Hemoglobin 13.4 12.0 - 15.0 g/dL   HCT 40.4 36.0 - 46.0 %   MCV 88.2 78.0 - 100.0 fL   MCH 29.3 26.0 - 34.0 pg   MCHC 33.2 30.0 - 36.0 g/dL   RDW 12.2 11.5 - 15.5 %   Platelets 312 150 - 400 K/uL     Comment: Performed at [facility], [place] [address] [place], [place] [address]  Rapid urine drug screen (hospital performed)     Status: None   Collection Time: 03/06/18  9:13 PM  Result Value Ref Range   Opiates NONE DETECTED NONE DETECTED  Cocaine NONE DETECTED NONE DETECTED   Benzodiazepines NONE DETECTED NONE DETECTED   Amphetamines NONE DETECTED NONE DETECTED   Tetrahydrocannabinol NONE DETECTED NONE DETECTED   Barbiturates NONE DETECTED NONE DETECTED    Comment: (NOTE) DRUG SCREEN FOR MEDICAL PURPOSES ONLY.  IF CONFIRMATION IS NEEDED FOR ANY PURPOSE, NOTIFY LAB WITHIN 5 DAYS. LOWEST DETECTABLE LIMITS FOR URINE DRUG SCREEN Drug Class                     Cutoff (ng/mL) Amphetamine and metabolites    1000 Barbiturate and metabolites    200 Benzodiazepine                 270 Tricyclics and metabolites     300 Opiates and metabolites        300 Cocaine and metabolites        300 THC                            50 Performed at [facility], [place] [address] [place], [place] [address]   I-Stat beta hCG blood, ED     Status: None   Collection Time: 03/06/18  9:31 PM  Result Value Ref Range   I-stat hCG, quantitative <5.0 <5 mIU/mL   Comment 3            Comment:   GEST. AGE      CONC.  (mIU/mL)   <=1 WEEK        5 - 50     2 WEEKS       50 - 500     3 WEEKS       100 - 10,000     4 WEEKS     1,000 - 30,000        FEMALE AND NON-PREGNANT FEMALE:     LESS THAN 5 mIU/mL     No current facility-administered medications for this encounter.    Current Outpatient Medications  Medication Sig Dispense Refill  . ibuprofen (ADVIL,MOTRIN) 200 MG tablet Take 600 mg by mouth daily as needed for moderate pain.     Marland Kitchen norethindrone (MICRONOR,CAMILA,ERRIN) 0.35 MG tablet Take 1 tablet by mouth daily.     Marland Kitchen tiZANidine (ZANAFLEX) 4 MG tablet TAKE 1 TABLET BY MOUTH EVERY NIGHT AT BEDTIME 30 tablet 0  . JUNEL FE 1/20 1-20 MG-MCG tablet TK 1 T PO QD.  PATIENT NEEDS TO SCHEDULE ANNUAL APPOINTMENT PRIOR TO FURTHER REFILLS. [facility].  2  . omeprazole (PRILOSEC) 20 MG capsule Take 1 capsule (20 mg total) by mouth daily. (Patient not taking: Reported on 12/30/2017) 14 capsule 0  . ondansetron (ZOFRAN) 4 MG tablet Take 1 tablet (4 mg total) by mouth every 6 (six) hours. (Patient not taking: Reported on 03/06/2018) 12 tablet 0  . oxyCODONE-acetaminophen (PERCOCET) 5-325 MG tablet Take 1 tablet by mouth every 4 (four) hours as needed. (Patient not taking: Reported on 03/06/2018) 10 tablet 0    Musculoskeletal: Strength & Muscle Tone: within normal limits Gait & Station: normal Patient leans: N/A  Psychiatric Specialty Exam: Physical Exam  Nursing note and vitals reviewed. Constitutional: She is oriented to person, place, and time. She appears well-developed and well-nourished.  HENT:  Head: Normocephalic and atraumatic.  Neck: Normal range of motion.  Respiratory: Effort normal.  Musculoskeletal: Normal range of motion.  Neurological: She is alert and oriented to person, place, and time.  Skin:  No rash noted.  Psychiatric: Her speech is normal and behavior is normal. Judgment and thought content normal. Cognition and memory are normal. She exhibits a depressed mood.    Review of Systems  Psychiatric/Behavioral: Positive for depression. Negative for hallucinations, substance abuse and suicidal ideas. The patient is nervous/anxious and has insomnia.   All other systems reviewed and are negative.   Blood pressure (!) 91/55, pulse 78, temperature 98.2 F (36.8 C), temperature source Oral, resp. rate 16, height _0  (1.549 m), weight 48.5 kg (107 lb), SpO2 100 %.Body mass index is 20.22 kg/m.  General Appearance: Casual  Eye Contact:  Good  Speech:  Clear and Coherent and Normal Rate  Volume:  Normal  Mood:  Anxious and Depressed  Affect:  Congruent and Depressed  Thought Process:  Coherent, Goal Directed and  Linear  Orientation:  Full (Time, Place, and Person)  Thought Content:  Logical  Suicidal Thoughts:  No  Homicidal Thoughts:  No  Memory:  Immediate;   Good Recent;   Good Remote;   Fair  Judgement:  Fair  Insight:  Fair  Psychomotor Activity:  Normal  Concentration:  Concentration: Good and Attention Span: Good  Recall:  Good  Fund of Knowledge:  Good  Language:  Good  Akathisia:  No  Handed:  Right  AIMS (if indicated):   N/A  Assets:  Communication Skills Desire for Improvement Financial Resources/Insurance Housing Social Support  ADL's:  Intact  Cognition:  WNL  Sleep:   Poor     Treatment Plan Summary: Plan Anxiety disorder, unspecified  Discharge Home Take all medications as prescribed  Avoid the use of alcohol and illicit drugs Follow up with [facility] in [place]  Disposition: No evidence of imminent risk to self or others at present.   Patient does not meet criteria for psychiatric inpatient admission. Supportive therapy provided about ongoing stressors. Discussed crisis plan, support from social network, calling 911, coming to the Emergency Department, and calling Suicide Hotline.  Ethelene Hal, NP 03/07/2018 11:44 AM   Patient seen face-to-face for psychiatric evaluation, chart reviewed and case discussed with the physician extender and developed treatment plan. Reviewed the information documented and agree with the treatment plan.  Buford Dresser, DO 03/08/18 12:28 AM

## 2018-03-07 NOTE — BH Assessment (Addendum)
[facility]  Per Juanetta BeetsJacqueline Norman, DO, this pt does not require psychiatric hospitalization at this time.  [facility] for [facility] and therapy.  This has been included in pt's discharge instructions.  Pt's nurse, Donnal DebarRandi, has been notified.  Doylene Canninghomas Honesty Menta, MA Triage Specialist [phone]   Addendum:  This Clinical research associatewriter spoke to pt, asking her to sign Consent to Release Information to the [facility], to which she agreed.  A notification call has been placed to them.  Pt expressed some dissatisfaction with the services that she has received from them.  I offered to enter alternatives into her discharge instructions.  These now include referral information for the [facility], for [facility], and for the [facility].  Donnal DebarRandi has been notified.  Doylene Canninghomas Lian Pounds, [place] Behavioral Health Coordinator [phone]

## 2018-03-17 ENCOUNTER — Encounter: Payer: Self-pay | Admitting: Family Medicine

## 2018-03-17 ENCOUNTER — Ambulatory Visit (INDEPENDENT_AMBULATORY_CARE_PROVIDER_SITE_OTHER): Payer: BLUE CROSS/BLUE SHIELD | Admitting: Family Medicine

## 2018-03-17 ENCOUNTER — Other Ambulatory Visit: Payer: Self-pay

## 2018-03-17 VITALS — BP 98/64 | HR 88 | Temp 97.5°F | Ht 61.0 in | Wt 105.8 lb

## 2018-03-17 DIAGNOSIS — F411 Generalized anxiety disorder: Secondary | ICD-10-CM | POA: Diagnosis not present

## 2018-03-17 DIAGNOSIS — G4700 Insomnia, unspecified: Secondary | ICD-10-CM | POA: Diagnosis not present

## 2018-03-17 MED ORDER — TRAZODONE HCL 50 MG PO TABS: 25.0000 mg | ORAL_TABLET | Freq: Every evening | ORAL | 3 refills | Status: DC | PRN

## 2018-03-17 MED ORDER — SERTRALINE HCL 50 MG PO TABS: 50.0000 mg | ORAL_TABLET | Freq: Every day | ORAL | 3 refills | Status: DC

## 2018-03-17 NOTE — Patient Instructions (Addendum)
  1. Start sertraline at 1/2 tablet once a day for a week. If it makes you sleepy ok to take at bedtime. Then increase to 1 tab daily. This medication is for anxiety. It takes 2-3 weeks to start noticing benefit. 2. Trazodone is for sleep. Adjust dose accordingly, max dose 100mg  at bedtime.   IF you received an x-ray today, you will receive an invoice from [facility]. Please contact [facility] at [phone] with questions or concerns regarding your invoice.   IF you received labwork today, you will receive an invoice from [facility]. Please contact [facility] at [phone] with questions or concerns regarding your invoice.   Our billing staff will not be able to assist you with questions regarding bills from these companies.  You will be contacted with the lab results as soon as they are available. The fastest way to get your results is to activate your My Chart account. Instructions are located on the last page of this paperwork. If you have not heard from us regarding the results in 2 weeks, please contact this office.

## 2018-03-17 NOTE — Progress Notes (Signed)
4/18/20198:20 AM  Rock NephewSabrina Myers 04/04/1996, 22 y.o. female [id number]  Chief Complaint  Patient presents with  . Insomnia    haes had trouble sleeping for the past 3 mos    HPI:   Patient is a 22 y.o. female with past medical history significant for anxiety, depression and Takayasu's arteritis who presents today for worsening insomnia, anxiety and depression for past 4 months  She states she was diagnosed with depression in 2013, treated with counseling However has tired cymbalta - too restless and amitriptyline - too groogy, for chronic pain from her TA Reports anxiety is main symptom, significantly interferes with her sleep, as unable to get her brain to quiet down The less she sleeps the more depressed she gets Long standing passive  SI, h/o inflecting non-lethal self-harm, no active SI, no HI States that she is so tired that she sometimes sees and hears things, no frank hallucinations No mania Does not snore Unknown fhx - adopted [place] age 8m[place] from [place] Full time student and in a full internship, she identifies increased stressors as current triggers She tries to get 8 hours of sleep a night, has tried meditation, journaling, music, OTC sleep meds  Seen in ED 03/06/18 for depression, evaluated by [facility], observed overnight, no meds started  Semester break starts May 1st, she will be going back to [place] with her family Currently lives with roommates Denies any alcohol or drug use  Depression screen [facility] 2/9 03/17/2018  Decreased Interest 0  Down, Depressed, Hopeless 0  PHQ - 2 Score 0    No Known Allergies  Prior to Admission medications   Medication Sig Start Date End Date Taking? Authorizing Provider  JUNEL FE 1/20 1-20 MG-MCG tablet TK 1 T PO QD. PATIENT NEEDS TO SCHEDULE ANNUAL APPOINTMENT PRIOR TO FURTHER REFILLS. [facility]. 08/09/17  Yes [provider]    Past Medical History:  Diagnosis Date  . Arteritis, Takayasu ([facility])     . Takayasu's arteritis ([facility])     History reviewed. No pertinent surgical history.  Social History   Tobacco Use  . Smoking status: Never Smoker  . Smokeless tobacco: Never Used  Substance Use Topics  . Alcohol use: No    Family History  Adopted: Yes  Problem Relation Age of Onset  . Healthy Mother   . Healthy Father   . Healthy Brother     Review of Systems  Musculoskeletal: Positive for back pain.  Psychiatric/Behavioral: Positive for depression and suicidal ideas. Negative for hallucinations and substance abuse. The patient is nervous/anxious and has insomnia.   All other systems reviewed and are negative.  Per hpi  OBJECTIVE:  Blood pressure 98/64, pulse 88, temperature (!) 97.5 F (36.4 C), temperature source Oral, height 5\' 1"  (1.549 m), weight 105 lb 12.8 oz (48 kg), last menstrual period 02/14/2018, SpO2 100 %.  Physical Exam  Constitutional: She is oriented to person, place, and time.  HENT:  Head: Normocephalic and atraumatic.  Mouth/Throat: Mucous membranes are normal.  Eyes: Pupils are equal, round, and reactive to light. EOM are normal. No scleral icterus.  Neck: Neck supple.  Pulmonary/Chest: Effort normal.  Neurological: She is alert and oriented to person, place, and time.  Skin: Skin is warm and dry.  Nursing note and vitals reviewed.    ASSESSMENT and PLAN  1. Generalized anxiety disorder 2. Insomnia, unspecified type Discussed treatment options, will start with SSRI and trazodone, discussed r/se/b. Not using vistaril as high dose benadryl  has not helped in the past. Consider augmentation with buspar vs alpha agonist if needed. RTC precautions reviewed.   Other orders - sertraline (ZOLOFT) 50 MG tablet; Take 1 tablet (50 mg total) by mouth daily. - traZODone (DESYREL) 50 MG tablet; Take 0.5-1 tablets (25-50 mg total) by mouth at bedtime as needed for sleep.  Return in about 3 weeks (around 04/07/2018).    Myles Lipps, MD Primary Care  at [facility] [address] [place], [place] [address] Ph.  [phone] Fax [phone]

## 2018-03-26 ENCOUNTER — Other Ambulatory Visit (INDEPENDENT_AMBULATORY_CARE_PROVIDER_SITE_OTHER): Payer: Self-pay | Admitting: Physical Medicine and Rehabilitation

## 2018-03-28 NOTE — Telephone Encounter (Signed)
Please advise 

## 2018-03-28 NOTE — Telephone Encounter (Signed)
Courtney please see PCP not on 4/18, pharmacy says PCP d/c'd source rx, I think she needs to ask PCP to refill or if ok to refill.

## 2018-03-30 NOTE — Telephone Encounter (Signed)
Patient states that she has no idea what this medication is and does not want a refill.

## 2018-03-31 ENCOUNTER — Ambulatory Visit (INDEPENDENT_AMBULATORY_CARE_PROVIDER_SITE_OTHER): Payer: BLUE CROSS/BLUE SHIELD | Admitting: Family Medicine

## 2018-03-31 ENCOUNTER — Encounter: Payer: Self-pay | Admitting: Family Medicine

## 2018-03-31 ENCOUNTER — Other Ambulatory Visit: Payer: Self-pay

## 2018-03-31 DIAGNOSIS — F419 Anxiety disorder, unspecified: Secondary | ICD-10-CM | POA: Diagnosis not present

## 2018-03-31 DIAGNOSIS — M314 Aortic arch syndrome [Takayasu]: Secondary | ICD-10-CM | POA: Insufficient documentation

## 2018-03-31 MED ORDER — TRAZODONE HCL 50 MG PO TABS: 25.0000 mg | ORAL_TABLET | Freq: Every evening | ORAL | 3 refills | Status: AC | PRN

## 2018-03-31 MED ORDER — SERTRALINE HCL 50 MG PO TABS: 50.0000 mg | ORAL_TABLET | Freq: Every day | ORAL | 3 refills | Status: AC

## 2018-03-31 NOTE — Patient Instructions (Signed)
     IF you received an x-ray today, you will receive an invoice from [facility]. Please contact [facility] at [phone] with questions or concerns regarding your invoice.   IF you received labwork today, you will receive an invoice from [facility]. Please contact [facility] at [phone] with questions or concerns regarding your invoice.   Our billing staff will not be able to assist you with questions regarding bills from these companies.  You will be contacted with the lab results as soon as they are available. The fastest way to get your results is to activate your My Chart account. Instructions are located on the last page of this paperwork. If you have not heard from us regarding the results in 2 weeks, please contact this office.     

## 2018-03-31 NOTE — Progress Notes (Signed)
5/2/20199:59 AM  Kelsey Myers 1996-01-27, 22 y.o. female [id number]  Chief Complaint  Patient presents with  . Follow-up    medication is doing very well, the trazadone makes her a little groggy if she takes it too late in the day.     HPI:   Patient is a 22 y.o. female with past medical history significant for anxiety and Takayasu Arteritis who presents today for followup on her anxiety  Last visit started on sertraline and trazadone States doing really well on current meds Anxiety completely controlled Sleeping really well on trazadone, has had 8 hours of sleep every night for over the past week. It does maker her a bit groogy in the morning, so she needs to be mindful of when she takes it at night. She has not tried taking 1/2 tab. She has been taking full tab nightly.  She has finals next week and then will go back home to [place] She will be taking som summer classes, will be done in June and then will be completely off, does not need to work this summer She will be seeing her rheumatologist She is still trying to establish with a psychiatrist She has no acute concerns today   Fall Risk  03/31/2018 03/17/2018  Falls in the past year? No No     Depression screen [facility] 2/9 03/31/2018 03/17/2018  Decreased Interest 0 0  Down, Depressed, Hopeless 0 0  PHQ - 2 Score 0 0    No Known Allergies  Prior to Admission medications   Medication Sig Start Date End Date Taking? Authorizing Provider  JUNEL FE 1/20 1-20 MG-MCG tablet TK 1 T PO QD. PATIENT NEEDS TO SCHEDULE ANNUAL APPOINTMENT PRIOR TO FURTHER REFILLS. [facility]CRO[facility]ST[facility] FE. 08/09/17   [provider]  sertraline (ZOLOFT) 50 MG tablet Take 1 tablet (50 mg total) by mouth daily. 03/17/18   Myles Lipps, MD  traZODone (DESYREL) 50 MG tablet Take 0.5-1 tablets (25-50 mg total) by mouth at bedtime as needed for sleep. 03/17/18   Myles Lipps, MD    Past Medical History:  Diagnosis Date  .  Arteritis, Takayasu (H[facility])   . Takayasu's arteritis ([facility])     History reviewed. No pertinent surgical history.  Social History   Tobacco Use  . Smoking status: Never Smoker  . Smokeless tobacco: Never Used  Substance Use Topics  . Alcohol use: No    Family History  Adopted: Yes  Problem Relation Age of Onset  . Healthy Mother   . Healthy Father   . Healthy Brother     Review of Systems  Constitutional: Negative for malaise/fatigue.  Psychiatric/Behavioral: Negative for depression. The patient is not nervous/anxious and does not have insomnia.      OBJECTIVE:  Blood pressure 98/60, pulse 86, temperature 98 F (36.7 C), temperature source Oral, height  (1.575 m), weight 103 lb (46.7 kg), last menstrual period 01/29/2018, SpO2 98 %.  Physical Exam  Constitutional: She is oriented to person, place, and time. She appears well-developed and well-nourished.  HENT:  Head: Normocephalic and atraumatic.  Mouth/Throat: Mucous membranes are normal.  Eyes: Pupils are equal, round, and reactive to light. EOM are normal. No scleral icterus.  Neck: Neck supple.  Pulmonary/Chest: Effort normal.  Neurological: She is alert and oriented to person, place, and time. Gait normal.  Skin: Skin is warm and dry.  Psychiatric: She has a normal mood and affect.  Nursing note and vitals reviewed.  ASSESSMENT and PLAN  1. Anxiety Patient doing well, cont current regime. Discussed doing 1/2 tab of trazodone. Might also do trial wo trazadone and see if insomnia resolved now that anxiety improved.   Other orders - sertraline (ZOLOFT) 50 MG tablet; Take 1 tablet (50 mg total) by mouth daily. - traZODone (DESYREL) 50 MG tablet; Take 0.5-1 tablets (25-50 mg total) by mouth at bedtime as needed for sleep.  Return in about 3 months (around 07/01/2018).    Myles Lipps, MD Primary Care at [facility] [address] [place], [place] [address] Ph.  [phone] Fax [phone]

## 2018-05-26 ENCOUNTER — Telehealth: Payer: Self-pay | Admitting: Family Medicine

## 2018-05-26 NOTE — Telephone Encounter (Signed)
Copied from CRM [phone]#[id number]. Topic: Quick Communication - Rx Refill/Question >> May 26, 2018  2:51 PM Alexander BergeronBarksdale, Harvey B wrote: Medication: traZODone (DESYREL) 50 MG tablet [[id number]][[id number]]   Pt called to discuss the medication above about staying on the medication a little longer or lower the dose but she was told to wean off the medication, contact pt to advise

## 2018-05-27 ENCOUNTER — Ambulatory Visit: Payer: Self-pay | Admitting: *Deleted

## 2018-05-27 NOTE — Telephone Encounter (Signed)
Called left VM to call back and discuss her medication questians

## 2018-05-27 NOTE — Telephone Encounter (Signed)
Pt would like Dr. Leretha PolSantiago to know she has been attempting to wean off Deseryl but is having difficulty sleeping again. She is taking every other night. Pt requesting to stay on Restoril longer or be placed on alternate medication.  Please advise: [phone]  Reason for Disposition . Caller has NON-URGENT medication question about med that PCP prescribed and triager unable to answer question  Answer Assessment - Initial Assessment Questions 1. SYMPTOMS: "Do you have any symptoms?"     Unable to sleep, trying to wean off Restoril as directed by Dr. Leretha PolSantiago 2. SEVERITY: If symptoms are present, ask "Are they mild, moderate or severe?"     N/A  Protocols used: MEDICATION QUESTION CALL-A-AH

## 2018-05-31 NOTE — Telephone Encounter (Signed)
Spoke with patient. Clarified, doing well on 25mg  trazodone and sertaline. Sleeping well. Will continue current regime. No refills needed.

## 2018-06-24 DIAGNOSIS — M314 Aortic arch syndrome [Takayasu]: Secondary | ICD-10-CM | POA: Diagnosis not present

## 2018-06-24 DIAGNOSIS — Z79899 Other long term (current) drug therapy: Secondary | ICD-10-CM | POA: Diagnosis not present

## 2018-07-11 ENCOUNTER — Other Ambulatory Visit: Payer: Self-pay | Admitting: Family Medicine

## 2018-07-11 NOTE — Telephone Encounter (Signed)
Trazodone refill Last Refill:03/17/18 #30 with 3 refills Last OV: 03/31/18 PCP: Dr. Leretha PolSantiago

## 2018-07-15 NOTE — Telephone Encounter (Signed)
Med refilled but needs to be scheduled for follow up visit. Last seen 3 months ago.  Thanks I Leretha PolSantiago, MD

## 2018-08-04 ENCOUNTER — Other Ambulatory Visit: Payer: Self-pay

## 2018-08-04 ENCOUNTER — Emergency Department (HOSPITAL_COMMUNITY)
Admission: EM | Admit: 2018-08-04 | Discharge: 2018-08-04 | Disposition: A | Discharge status: Home / Self Care | Payer: BLUE CROSS/BLUE SHIELD | Attending: Emergency Medicine | Admitting: Emergency Medicine

## 2018-08-04 ENCOUNTER — Ambulatory Visit: Payer: Self-pay

## 2018-08-04 DIAGNOSIS — M545 Low back pain, unspecified: Secondary | ICD-10-CM

## 2018-08-04 DIAGNOSIS — G8929 Other chronic pain: Secondary | ICD-10-CM | POA: Insufficient documentation

## 2018-08-04 DIAGNOSIS — R1084 Generalized abdominal pain: Secondary | ICD-10-CM | POA: Diagnosis not present

## 2018-08-04 DIAGNOSIS — R109 Unspecified abdominal pain: Secondary | ICD-10-CM

## 2018-08-04 DIAGNOSIS — W19XXXA Unspecified fall, initial encounter: Secondary | ICD-10-CM | POA: Diagnosis not present

## 2018-08-04 LAB — COMPREHENSIVE METABOLIC PANEL
ALK PHOS: 51 U/L (ref 38–126)
ALT: 19 U/L (ref 0–44)
ANION GAP: 9 (ref 5–15)
AST: 24 U/L (ref 15–41)
Albumin: 4.6 g/dL (ref 3.5–5.0)
BUN: 11 mg/dL (ref 6–20)
CALCIUM: 9.1 mg/dL (ref 8.9–10.3)
CO2: 24 mmol/L (ref 22–32)
Chloride: 107 mmol/L (ref 98–111)
Creatinine, Ser: 0.88 mg/dL (ref 0.44–1.00)
GFR calc non Af Amer: >60 mL/min (ref >60)
Glucose, Bld: 123 mg/dL — ABNORMAL HIGH (ref 70–99)
POTASSIUM: 3.9 mmol/L (ref 3.5–5.1)
SODIUM: 140 mmol/L (ref 135–145)
TOTAL PROTEIN: 7.2 g/dL (ref 6.5–8.1)
Total Bilirubin: 0.6 mg/dL (ref 0.3–1.2)

## 2018-08-04 LAB — URINALYSIS, ROUTINE W REFLEX MICROSCOPIC
BILIRUBIN URINE: NEGATIVE
GLUCOSE, UA: NEGATIVE mg/dL
HGB URINE DIPSTICK: NEGATIVE
KETONES UR: NEGATIVE mg/dL
NITRITE: NEGATIVE
PH: 7 (ref 5.0–8.0)
Protein, ur: NEGATIVE mg/dL
Specific Gravity, Urine: 1.016 (ref 1.005–1.030)

## 2018-08-04 LAB — CBC
HEMATOCRIT: 40.8 % (ref 36.0–46.0)
HEMOGLOBIN: 13.9 g/dL (ref 12.0–15.0)
MCH: 29.6 pg (ref 26.0–34.0)
MCHC: 34.1 g/dL (ref 30.0–36.0)
MCV: 86.8 fL (ref 78.0–100.0)
Platelets: 310 10*3/uL (ref 150–400)
RBC: 4.7 MIL/uL (ref 3.87–5.11)
RDW: 12.4 % (ref 11.5–15.5)
WBC: 6.7 10*3/uL (ref 4.0–10.5)

## 2018-08-04 LAB — I-STAT BETA HCG BLOOD, ED (MC, WL, AP ONLY): I-stat hCG, quantitative: <5 m[IU]/mL (ref <5)

## 2018-08-04 LAB — LIPASE, BLOOD: Lipase: 36 U/L (ref 11–51)

## 2018-08-04 MED ORDER — DIAZEPAM 5 MG PO TABS
5.0000 mg | ORAL_TABLET | Freq: Once | ORAL | Status: AC
  Administered 2018-08-04: 5 mg via ORAL
  Filled 2018-08-04: qty 1

## 2018-08-04 MED ORDER — KETAMINE HCL 50 MG/5ML IJ SOSY
10.0000 mg | PREFILLED_SYRINGE | Freq: Once | INTRAMUSCULAR | Status: AC
  Administered 2018-08-04: 10 mg via INTRAVENOUS
  Filled 2018-08-04: qty 5

## 2018-08-04 MED ORDER — KETOROLAC TROMETHAMINE 30 MG/ML IJ SOLN
15.0000 mg | Freq: Once | INTRAMUSCULAR | Status: AC
  Administered 2018-08-04: 15 mg via INTRAVENOUS
  Filled 2018-08-04: qty 1

## 2018-08-04 MED ORDER — ACETAMINOPHEN 500 MG PO TABS
1000.0000 mg | ORAL_TABLET | Freq: Once | ORAL | Status: AC
  Administered 2018-08-04: 1000 mg via ORAL
  Filled 2018-08-04: qty 2

## 2018-08-04 MED ORDER — OXYCODONE HCL 5 MG PO TABS
5.0000 mg | ORAL_TABLET | Freq: Once | ORAL | Status: AC
  Administered 2018-08-04: 5 mg via ORAL
  Filled 2018-08-04: qty 1

## 2018-08-04 MED ORDER — GI COCKTAIL ~~LOC~~
30.0000 mL | Freq: Once | ORAL | Status: AC
  Administered 2018-08-04: 30 mL via ORAL
  Filled 2018-08-04: qty 30

## 2018-08-04 NOTE — Telephone Encounter (Signed)
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 Has been hurting x 3 days. Has been trying ice and heat, has taken IBU. No availability in office. Pt. Reports she will go to ED.  Reason for Disposition . [1] SEVERE back pain (e.g., excruciating, unable to do any normal activities) AND [2] not improved 2 hours after pain medicine  Answer Assessment - Initial Assessment Questions 1. ONSET: "When did the pain begin?"      3 days ago 2. LOCATION: "Where does it hurt?" (upper, mid or lower back)     Lower right flank to upper abdomen 3. SEVERITY: "How bad is the pain?"  (e.g., Scale 1-10; mild, moderate, or severe)   - MILD (1-3): doesn't interfere with normal activities    - MODERATE (4-7): interferes with normal activities or awakens from sleep    - SEVERE (8-10): excruciating pain, unable to do any normal activities        10 4. PATTERN: "Is the pain constant?" (e.g., yes, no; constant, intermittent)      Constant 5. RADIATION: "Does the pain shoot into your legs or elsewhere?"     Into upper abdomen 6. CAUSE:  "What do you think is causing the back pain?"      Arteritis 7. BACK OVERUSE:  "Any recent lifting of heavy objects, strenuous work or exercise?"     No 8. MEDICATIONS: "What have you taken so far for the pain?" (e.g., nothing, acetaminophen, NSAIDS)     Ibu 9. NEUROLOGIC SYMPTOMS: "Do you have any weakness, numbness, or problems with bowel/bladder control?"     Vomited 10. OTHER SYMPTOMS: "Do you have any other symptoms?" (e.g., fever, abdominal pain, burning with urination, blood in urine)       Vomited 11. PREGNANCY: "Is there any chance you are pregnant?" (e.g., yes, no; LMP)       No  Protocols used: BACK PAIN-A-AH

## 2018-08-04 NOTE — ED Notes (Signed)
Pt ambulated to restroom. 

## 2018-08-04 NOTE — ED Triage Notes (Signed)
Pt reports she has back pain and abdominal pain. 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  Pt reports the pain started a few days ago.  Pt reports nothing helps make it better. She reports she has tried hot/cold therapy, icy hot, aleve, and ibuprofen with no relief.

## 2018-08-04 NOTE — ED Provider Notes (Signed)
[facility]-EMERGENCY DEPT Provider Note   CSN: [id number] Arrival date & time: 08/04/18  1433     History   Chief Complaint Chief Complaint  Patient presents with  . Back Pain  . Abdominal Pain    HPI Kelsey Myers is a 22 y.o. female.  22 yo F with a chief complaint of right-sided abdominal and back pain.  This been going on for the past 2 or 3 days.  Is been an ongoing issue for her.  Going on since the past 6 years or so.  She had been diagnosed at some point with Takayasu's arteritis.  Since then she has seen multiple providers searching for a cause to her pain.  She most recently saw GYN who thought it was more a GI etiology.  She was diagnosed her with IBS C.  The pain seems to occur at the end of her menstrual cycle or after she eats certain foods.  It is usually crampy upper abdomen and to the right side.  Worse with movement and palpation and twisting.  She denies fevers or chills.  One episode of emesis.  Denies urinary symptoms denies vaginal bleeding or discharge.  The history is provided by the patient.  Back Pain   This is a new problem. The current episode started 2 days ago. The problem occurs constantly. The problem has been gradually worsening. The pain is associated with no known injury. The pain is present in the lumbar spine. The quality of the pain is described as stabbing and shooting. The pain does not radiate. The pain is at a severity of 8/10. The pain is moderate. Associated symptoms include abdominal pain. Pertinent negatives include no chest pain, no fever, no headaches and no dysuria. She has tried nothing for the symptoms. The treatment provided no relief.  Abdominal Pain   Pertinent negatives include fever, nausea, vomiting, dysuria, headaches, arthralgias and myalgias.    Past Medical History:  Diagnosis Date  . Anxiety   . Arteritis, Takayasu [facility])     Patient Active Problem List   Diagnosis Date Noted  . Anxiety   .  Arteritis, Takayasu (HCC)   . Anxiety disorder, unspecified 03/07/2018  . Chronic right-sided low back pain without sciatica 10/20/2017  . Pain of upper abdomen 10/20/2017  . Takayasu's arteritis (HCC) 10/20/2017    No past surgical history on file.   OB History   None      Home Medications    Prior to Admission medications   Medication Sig Start Date End Date Taking? Authorizing Provider  acetaminophen (TYLENOL) 325 MG tablet Take 325 mg by mouth every 6 (six) hours as needed for mild pain.   Yes [provider]  ibuprofen (ADVIL,MOTRIN) 200 MG tablet Take 400 mg by mouth every 6 (six) hours as needed for moderate pain.   Yes [provider]  JUNEL FE 1/20 1-20 MG-MCG tablet Take 1 tablet by mouth daily.    Yes [provider]  sertraline (ZOLOFT) 50 MG tablet Take 1 tablet (50 mg total) by mouth daily. 03/31/18  Yes Myles Lipps, MD  tiZANidine (ZANAFLEX) 4 MG tablet Take 4 mg by mouth daily as needed (BACK PAIN).    Yes [provider]  traZODone (DESYREL) 50 MG tablet Take 0.5-1 tablets (25-50 mg total) by mouth at bedtime as needed for sleep. Patient taking differently: Take 25 mg by mouth at bedtime as needed for sleep.  03/31/18  Yes Myles Lipps, MD  sertraline (ZOLOFT) 50 MG tablet TAKE 1 TABLET(50 MG) BY MOUTH DAILY Patient not taking: Reported on 08/04/2018 07/11/18   Myles Lipps, MD  traZODone (DESYREL) 50 MG tablet TAKE 1/2 TO 1 TABLET(25 TO 50 MG) BY MOUTH AT BEDTIME AS NEEDED FOR SLEEP Patient not taking: Reported on 08/04/2018 07/15/18   Myles Lipps, MD    Family History Family History  Adopted: Yes  Problem Relation Age of Onset  . Healthy Mother   . Healthy Father   . Healthy Brother     Social History Social History   Tobacco Use  . Smoking status: Never Smoker  . Smokeless tobacco: Never Used  Substance Use Topics  . Alcohol use: No  . Drug use: No     Allergies   Patient has no known  allergies.   Review of Systems Review of Systems  Constitutional: Negative for chills and fever.  HENT: Negative for congestion and rhinorrhea.   Eyes: Negative for redness and visual disturbance.  Respiratory: Negative for shortness of breath and wheezing.   Cardiovascular: Negative for chest pain and palpitations.  Gastrointestinal: Positive for abdominal pain. Negative for nausea and vomiting.  Genitourinary: Negative for dysuria and urgency.  Musculoskeletal: Positive for back pain. Negative for arthralgias and myalgias.  Skin: Negative for pallor and wound.  Neurological: Negative for dizziness and headaches.     Physical Exam Updated Vital Signs BP (!) 138/100 (BP Location: Left Arm) Comment: Pt is complaining of pain  Pulse 74   Temp 98.8 F (37.1 C)   Resp 20   LMP 08/01/2018   SpO2 100%   Physical Exam  Constitutional: She is oriented to person, place, and time. She appears well-developed and well-nourished. No distress.  HENT:  Head: Normocephalic and atraumatic.  Eyes: Pupils are equal, round, and reactive to light. EOM are normal.  Neck: Normal range of motion. Neck supple.  Cardiovascular: Normal rate and regular rhythm. Exam reveals no gallop and no friction rub.  No murmur heard. Pulmonary/Chest: Effort normal. She has no wheezes. She has no rales.  Abdominal: Soft. She exhibits no distension. There is tenderness (diffuse upper).  Musculoskeletal: She exhibits tenderness. She exhibits no edema.  Diffuse tenderness about the right flank  Neurological: She is alert and oriented to person, place, and time.  Skin: Skin is warm and dry. She is not diaphoretic.  Psychiatric: She has a normal mood and affect. Her behavior is normal.  Nursing note and vitals reviewed.    ED Treatments / Results  Labs (all labs ordered are listed, but only abnormal results are displayed) Labs Reviewed  COMPREHENSIVE METABOLIC PANEL - Abnormal; Notable for the following  components:      Result Value   Glucose, Bld 123 (*)    All other components within normal limits  URINALYSIS, ROUTINE W REFLEX MICROSCOPIC - Abnormal; Notable for the following components:   APPearance HAZY (*)    Leukocytes, UA TRACE (*)    Bacteria, UA RARE (*)    All other components within normal limits  LIPASE, BLOOD  CBC  I-STAT BETA HCG BLOOD, ED ([facility], [facility], [facility] ONLY)    EKG None  Radiology No results found.  Procedures Procedures (including critical care time)  Medications Ordered in ED Medications  acetaminophen (TYLENOL) tablet 1,000 mg (1,000 mg Oral Given 08/04/18 1755)  ketorolac (TORADOL) 30 MG/ML injection 15 mg (15 mg Intravenous Given 08/04/18 1755)  oxyCODONE (Oxy IR/ROXICODONE) immediate release tablet 5 mg (5 mg Oral Given  08/04/18 1755)  diazepam (VALIUM) tablet 5 mg (5 mg Oral Given 08/04/18 1755)  gi cocktail (Maalox,Lidocaine,Donnatal) (30 mLs Oral Given 08/04/18 1755)  ketamine 50 mg in normal saline 5 mL (10 mg/mL) syringe (10 mg Intravenous Given 08/04/18 1900)     Initial Impression / Assessment and Plan / ED Course  I have reviewed the triage vital signs and the nursing notes.  Pertinent labs & imaging results that were available during my care of the patient were reviewed by me and considered in my medical decision making (see chart for details).     22 yo F with a cc of right sided pain.  This is been a chronic issue for her.  She has seen multiple specialist with no known definitive etiology.  At some point she was diagnosed with Takayasu's arteritis.  On my exam the patient is well-appearing and nontoxic she has mild diffuse tenderness across the upper abdomen and the right side of her back.  No midline tenderness.  She is able to ambulate without difficulty.  This could all be muscular skeletal back pain or potentially reflux disease.  She carries a diagnosis of IBS for the symptoms that seem to occur at the end of her menstrual cycle.  She is being worked  up for possible endometriosis.  She has me if I could order a HIDA scan to fully evaluate her gallbladder.  Her laboratory evaluation here is unremarkable.  She has a mildly elevated glucose.  Her lipase is normal.  She is not pregnant.  No leukocytosis.  I do not feel strongly about repeating any advanced imaging on her.  She has had a CT angiogram as well as CT scan with IV contrast and pelvic ultrasound for symptoms previously.  I discussed this with her and she agrees to not do a repeat CT scan at this time.  The patient's urine is negative for infection, no signs of blood.  Doubt that this is a kidney stone.  Patient reassessed and was feeling better and then worse again.  She is already received NSAIDs Tylenol and muscle relaxants and narcotic.  We will do a pain dose of ketamine.  Patient had some improvement.  I do not feel that imaging is warranted for this issue that is most likely chronic.  We will have her follow-up with her GI doctor as well as her PCP.  7:04 PM:  I have discussed the diagnosis/risks/treatment options with the patient and believe the pt to be eligible for discharge home to follow-up with PCP, GI. We also discussed returning to the ED immediately if new or worsening sx occur. We discussed the sx which are most concerning (e.g., sudden worsening pain, fever, inability to tolerate by mouth) that necessitate immediate return. Medications administered to the patient during their visit and any new prescriptions provided to the patient are listed below.  Medications given during this visit Medications  acetaminophen (TYLENOL) tablet 1,000 mg (1,000 mg Oral Given 08/04/18 1755)  ketorolac (TORADOL) 30 MG/ML injection 15 mg (15 mg Intravenous Given 08/04/18 1755)  oxyCODONE (Oxy IR/ROXICODONE) immediate release tablet 5 mg (5 mg Oral Given 08/04/18 1755)  diazepam (VALIUM) tablet 5 mg (5 mg Oral Given 08/04/18 1755)  gi cocktail (Maalox,Lidocaine,Donnatal) (30 mLs Oral Given 08/04/18 1755)   ketamine 50 mg in normal saline 5 mL (10 mg/mL) syringe (10 mg Intravenous Given 08/04/18 1900)      The patient appears reasonably screen and/or stabilized for discharge and I doubt any other medical  condition or other [facility] requiring further screening, evaluation, or treatment in the ED at this time prior to discharge.    Final Clinical Impressions(s) / ED Diagnoses   Final diagnoses:  Acute right-sided low back pain without sciatica  Chronic abdominal pain    ED Discharge Orders    None       Melene Plan, DO 08/04/18 1904

## 2018-08-04 NOTE — Discharge Instructions (Signed)
Please return for worsening pain, fever, inability to eat or drink.  Your labwork today didn't show any acute abnormality.  Your urine was not infected or concerning for a kidney stone.  Please follow up with your PCP and GI doctor.    Take 4 over the counter ibuprofen tablets 3 times a day or 2 over-the-counter naproxen tablets twice a day for pain. Also take tylenol 1000mg (2 extra strength) four times a day.    Try zantac or pepcid twice a day.  Try to avoid things that may make this worse, most commonly these are spicy foods tomato based products fatty foods chocolate and peppermint.  Alcohol and tobacco can also make this worse.  Return to the emergency department for sudden worsening pain fever or inability to eat or drink.

## 2018-08-04 NOTE — Telephone Encounter (Signed)
Just an FYI

## 2018-08-18 DIAGNOSIS — R109 Unspecified abdominal pain: Secondary | ICD-10-CM | POA: Diagnosis not present

## 2018-08-26 DIAGNOSIS — R1011 Right upper quadrant pain: Secondary | ICD-10-CM | POA: Diagnosis not present

## 2018-08-30 ENCOUNTER — Emergency Department (HOSPITAL_COMMUNITY): Payer: BLUE CROSS/BLUE SHIELD

## 2018-08-30 ENCOUNTER — Encounter (HOSPITAL_COMMUNITY): Payer: Self-pay | Admitting: Emergency Medicine

## 2018-08-30 ENCOUNTER — Emergency Department (HOSPITAL_COMMUNITY)
Admission: EM | Admit: 2018-08-30 | Discharge: 2018-08-30 | Disposition: A | Discharge status: Home / Self Care | Payer: BLUE CROSS/BLUE SHIELD | Attending: Emergency Medicine | Admitting: Emergency Medicine

## 2018-08-30 DIAGNOSIS — S3991XA Unspecified injury of abdomen, initial encounter: Secondary | ICD-10-CM | POA: Diagnosis not present

## 2018-08-30 DIAGNOSIS — R51 Headache: Secondary | ICD-10-CM | POA: Insufficient documentation

## 2018-08-30 DIAGNOSIS — R9431 Abnormal electrocardiogram [ECG] [EKG]: Secondary | ICD-10-CM | POA: Diagnosis not present

## 2018-08-30 DIAGNOSIS — R52 Pain, unspecified: Secondary | ICD-10-CM | POA: Diagnosis not present

## 2018-08-30 DIAGNOSIS — R109 Unspecified abdominal pain: Secondary | ICD-10-CM | POA: Diagnosis not present

## 2018-08-30 DIAGNOSIS — S6992XA Unspecified injury of left wrist, hand and finger(s), initial encounter: Secondary | ICD-10-CM | POA: Diagnosis not present

## 2018-08-30 DIAGNOSIS — M25532 Pain in left wrist: Secondary | ICD-10-CM | POA: Diagnosis not present

## 2018-08-30 DIAGNOSIS — S0990XA Unspecified injury of head, initial encounter: Secondary | ICD-10-CM | POA: Diagnosis not present

## 2018-08-30 DIAGNOSIS — Z79899 Other long term (current) drug therapy: Secondary | ICD-10-CM | POA: Diagnosis not present

## 2018-08-30 DIAGNOSIS — M25539 Pain in unspecified wrist: Secondary | ICD-10-CM | POA: Diagnosis not present

## 2018-08-30 LAB — CBC
HCT: 39.1 % (ref 36.0–46.0)
Hemoglobin: 13.3 g/dL (ref 12.0–15.0)
MCH: 29.5 pg (ref 26.0–34.0)
MCHC: 34 g/dL (ref 30.0–36.0)
MCV: 86.7 fL (ref 78.0–100.0)
PLATELETS: 324 10*3/uL (ref 150–400)
RBC: 4.51 MIL/uL (ref 3.87–5.11)
RDW: 12.3 % (ref 11.5–15.5)
WBC: 6.9 10*3/uL (ref 4.0–10.5)

## 2018-08-30 LAB — URINALYSIS, ROUTINE W REFLEX MICROSCOPIC
Bilirubin Urine: NEGATIVE
Glucose, UA: 50 mg/dL — AB
Hgb urine dipstick: NEGATIVE
Ketones, ur: NEGATIVE mg/dL
LEUKOCYTES UA: NEGATIVE
NITRITE: NEGATIVE
PROTEIN: NEGATIVE mg/dL
Specific Gravity, Urine: 1.031 — ABNORMAL HIGH (ref 1.005–1.030)
pH: 9 — ABNORMAL HIGH (ref 5.0–8.0)

## 2018-08-30 LAB — COMPREHENSIVE METABOLIC PANEL
ALK PHOS: 48 U/L (ref 38–126)
ALT: 14 U/L (ref 0–44)
AST: 21 U/L (ref 15–41)
Albumin: 4.3 g/dL (ref 3.5–5.0)
Anion gap: 10 (ref 5–15)
BUN: 12 mg/dL (ref 6–20)
CALCIUM: 9.6 mg/dL (ref 8.9–10.3)
CO2: 21 mmol/L — ABNORMAL LOW (ref 22–32)
Chloride: 111 mmol/L (ref 98–111)
Creatinine, Ser: 0.7 mg/dL (ref 0.44–1.00)
Glucose, Bld: 115 mg/dL — ABNORMAL HIGH (ref 70–99)
Potassium: 3.6 mmol/L (ref 3.5–5.1)
Sodium: 142 mmol/L (ref 135–145)
Total Bilirubin: 0.5 mg/dL (ref 0.3–1.2)
Total Protein: 7.5 g/dL (ref 6.5–8.1)

## 2018-08-30 LAB — RAPID URINE DRUG SCREEN, HOSP PERFORMED
Amphetamines: NOT DETECTED
BARBITURATES: NOT DETECTED
Benzodiazepines: NOT DETECTED
COCAINE: NOT DETECTED
OPIATES: NOT DETECTED
Tetrahydrocannabinol: NOT DETECTED

## 2018-08-30 LAB — LIPASE, BLOOD: LIPASE: 34 U/L (ref 11–51)

## 2018-08-30 LAB — I-STAT BETA HCG BLOOD, ED (MC, WL, AP ONLY)

## 2018-08-30 MED ORDER — HYDROMORPHONE HCL 1 MG/ML IJ SOLN
1.0000 mg | Freq: Once | INTRAMUSCULAR | Status: AC
  Administered 2018-08-30: 1 mg via INTRAVENOUS
  Filled 2018-08-30: qty 1

## 2018-08-30 MED ORDER — OXYCODONE-ACETAMINOPHEN 5-325 MG PO TABS: 1.0000 | ORAL_TABLET | Freq: Once | ORAL | Status: DC

## 2018-08-30 MED ORDER — ONDANSETRON HCL 4 MG PO TABS: 4.0000 mg | ORAL_TABLET | Freq: Three times a day (TID) | ORAL | 0 refills | Status: AC | PRN

## 2018-08-30 MED ORDER — IOPAMIDOL (ISOVUE-370) INJECTION 76%
INTRAVENOUS | Status: AC
  Filled 2018-08-30: qty 100

## 2018-08-30 MED ORDER — SODIUM CHLORIDE 0.9 % IJ SOLN
INTRAMUSCULAR | Status: AC
  Filled 2018-08-30: qty 50

## 2018-08-30 MED ORDER — LACTATED RINGERS IV BOLUS
1000.0000 mL | Freq: Once | INTRAVENOUS | Status: AC
  Administered 2018-08-30: 1000 mL via INTRAVENOUS

## 2018-08-30 MED ORDER — IOPAMIDOL (ISOVUE-370) INJECTION 76%
100.0000 mL | Freq: Once | INTRAVENOUS | Status: AC | PRN
  Administered 2018-08-30: 100 mL via INTRAVENOUS

## 2018-08-30 MED ORDER — OXYCODONE-ACETAMINOPHEN 5-325 MG PO TABS: 1.0000 | ORAL_TABLET | Freq: Three times a day (TID) | ORAL | 0 refills | Status: AC | PRN

## 2018-08-30 NOTE — ED Triage Notes (Signed)
Per pt, states she has back and abdominal pain for 5 years-states disease in remission but recently having a flare up-states she fell from her bunk and might have hit head

## 2018-08-30 NOTE — ED Provider Notes (Signed)
Emergency Department Provider Note   I have reviewed the triage vital signs and the nursing notes.   HISTORY  Chief Complaint Flank Pain   HPI Kelsey Myers is a 22 y.o. female with medical problems documented below the presents the emergency department today with persistent right-sided pain.  Patient states she was seen here couple weeks ago with similar pain and never got better she has severe right flank pain that seems to radiate to her right abdomen.  Symptoms of chest pain is bad she does pass out and this happened today that she had pain severely and she fell off her bunk which is approximately 6 feet air.  She states that she lost consciousness but is not sure it was before after she hit the ground.  She has frontal headache and left wrist pain after the fall. No other associated or modifying symptoms.    Past Medical History:  Diagnosis Date  . Anxiety   . Arteritis, Takayasu [facility])     Patient Active Problem List   Diagnosis Date Noted  . Anxiety   . Arteritis, Takayasu (HCC)   . Anxiety disorder, unspecified 03/07/2018  . Chronic right-sided low back pain without sciatica 10/20/2017  . Pain of upper abdomen 10/20/2017  . Takayasu's arteritis (HCC) 10/20/2017    History reviewed. No pertinent surgical history.  Current Outpatient Rx  . Order #: [id number] Class: Historical Med  . Order #: [id number] Class: Historical Med  . Order #: [id number] Class: Historical Med  . Order #: [id number] Class: Normal  . Order #: [id number] Class: Normal  . Order #: [id number] Class: Print  . Order #: [id number] Class: Print    Allergies Patient has no known allergies.  Family History  Adopted: Yes  Problem Relation Age of Onset  . Healthy Mother   . Healthy Father   . Healthy Brother     Social History Social History   Tobacco Use  . Smoking status: Never Smoker  . Smokeless tobacco: Never Used  Substance Use Topics  . Alcohol use: No  . Drug use: No    Review of  Systems  All other systems negative except as documented in the HPI. All pertinent positives and negatives as reviewed in the HPI. ____________________________________________   PHYSICAL EXAM:  VITAL SIGNS: ED Triage Vitals  Enc Vitals Group     BP 08/30/18 1636 122/68     Pulse Rate 08/30/18 1636 (!) 101     Resp 08/30/18 1636 18     Temp --      Temp src --      SpO2 08/30/18 1636 100 %     Weight 08/30/18 1636 98 lb (44.5 kg)     Height 08/30/18 1636 5\' 1"  (1.549 m)     Head Circumference --      Peak Flow --      Pain Score 08/30/18 1649 9     Pain Loc --      Pain Edu? --      Excl. in GC? --     Constitutional: Alert and oriented. Well appearing and in no pain but no obvious distress otherwise. Eyes: Conjunctivae are normal. PERRL. EOMI. Head: ecchymosis over left forehead. Nose: No congestion/rhinnorhea. Mouth/Throat: Mucous membranes are moist.  Oropharynx non-erythematous. Neck: No stridor.  No meningeal signs.   Cardiovascular: Normal rate, regular rhythm. Good peripheral circulation. Grossly normal heart sounds.   Respiratory: Normal respiratory effort.  No retractions. Lungs CTAB. Gastrointestinal: Soft and nontender. No distention.  Musculoskeletal: No lower extremity  tenderness nor edema. No gross deformities of extremities. Ecchymosis and ttp over left wrist and distal forearm. Neurologic:  Normal speech and language. No gross focal neurologic deficits are appreciated.  Skin:  Skin is warm, dry and intact. No rash noted.  ____________________________________________   LABS (all labs ordered are listed, but only abnormal results are displayed)  Labs Reviewed  COMPREHENSIVE METABOLIC PANEL - Abnormal; Notable for the following components:      Result Value   CO2 21 (*)    Glucose, Bld 115 (*)    All other components within normal limits  URINALYSIS, ROUTINE W REFLEX MICROSCOPIC - Abnormal; Notable for the following components:   APPearance CLOUDY (*)      Specific Gravity, Urine 1.031 (*)    pH 9.0 (*)    Glucose, UA 50 (*)    All other components within normal limits  LIPASE, BLOOD  CBC  RAPID URINE DRUG SCREEN, HOSP PERFORMED  I-STAT BETA HCG BLOOD, ED ([facility], [facility], [facility] ONLY)   ____________________________________________  EKG   EKG Interpretation  Date/Time:  Tuesday August 30 2018 19:06:28 EDT Ventricular Rate:  76 PR Interval:  178 QRS Duration: 86 QT Interval:  424 QTC Calculation: 477 R Axis:   80 Text Interpretation:  Normal sinus rhythm Possible Left atrial enlargement Nonspecific T wave abnormality Prolonged QT Abnormal ECG No old tracing to compare Confirmed by Marily Memos [phone]) on 08/30/2018 7:48:08 PM       ____________________________________________  RADIOLOGY  Dg Wrist Complete Left  Result Date: 08/30/2018 CLINICAL DATA:  left wrist pain-fell off the top bunk today injuring wrist EXAM: LEFT WRIST - COMPLETE 3+ VIEW COMPARISON:  None. FINDINGS: There is evidence of a nondisplaced fracture at the base of the radial styloid, most convincing along the oblique view. No other evidence of a fracture. Wrist joints are normally spaced and aligned. Soft tissues are unremarkable. IMPRESSION: 1. Probable nondisplaced fracture at the base of the radial styloid. No other fractures. No dislocation. Electronically Signed   By: Amie Portland M.D.   On: 08/30/2018 19:09   Ct Head Wo Contrast  Result Date: 08/30/2018 CLINICAL DATA:  Head trauma headache EXAM: CT HEAD WITHOUT CONTRAST TECHNIQUE: Contiguous axial images were obtained from the base of the skull through the vertex without intravenous contrast. COMPARISON:  None. FINDINGS: Brain: No evidence of acute infarction, hemorrhage, hydrocephalus, extra-axial collection or mass lesion/mass effect. Vascular: No hyperdense vessel or unexpected calcification. Skull: Normal. Negative for fracture or focal lesion. Sinuses/Orbits: No acute finding. Other: None IMPRESSION: Negative non  contrasted CT appearance of the brain Electronically Signed   By: Jasmine Pang M.D.   On: 08/30/2018 19:09   Ct Angio Abd/pel W And/or Wo Contrast  Result Date: 08/30/2018 CLINICAL DATA:  Per pt, states she has back and abdominal pain for 5 years-states disease in remission but recently having a flare up-states she fell from her bunk and might have hit head EXAM: CTA ABDOMEN AND PELVIS wITHOUT AND WITH CONTRAST TECHNIQUE: Multidetector CT imaging of the abdomen and pelvis was performed using the standard protocol during bolus administration of intravenous contrast. Multiplanar reconstructed images and MIPs were obtained and reviewed to evaluate the vascular anatomy. CONTRAST:  ISOVUE-370 IOPAMIDOL (ISOVUE-370) INJECTION 76% COMPARISON:  12/27/2017 FINDINGS: VASCULAR Aorta: Normal in caliber.  No dissection.  No atherosclerosis. Celiac: Mild tortuosity at the origin. No atherosclerosis. No aneurysm. No dissection. SMA: Patent without evidence of aneurysm, dissection, vasculitis or significant stenosis. Renals: 2 renal arteries on the  right. Single renal artery on the left vessels are widely patent. No atherosclerosis dissection or aneurysm. IMA: Patent without evidence of aneurysm, dissection, vasculitis or significant stenosis. Inflow: Patent without evidence of aneurysm, dissection, vasculitis or significant stenosis. Proximal Outflow: Bilateral common femoral and visualized portions of the superficial and profunda femoral arteries are patent without evidence of aneurysm, dissection, vasculitis or significant stenosis. Veins: No obvious venous abnormality within the limitations of this arterial phase study. Review of the MIP images confirms the above findings. NON-VASCULAR Lower chest: Clear lung bases.  Heart normal in size. Hepatobiliary: Normal in size and attenuation. No contusion or laceration. No mass or focal lesion. Normal gallbladder. No bile duct dilation. Pancreas: No contusion or laceration.   No mass or inflammation. Spleen: No contusion or laceration. Normal in size. No mass or focal lesion. Adrenals/Urinary Tract: No adrenal mass or hemorrhage. No renal masses or stones. No contusion or laceration. No hydronephrosis. Normal ureters. Normal bladder. Stomach/Bowel: Normal stomach. Small bowel and colon are normal in caliber. No wall thickening or inflammation. No evidence of injury. No mesenteric hematoma. Appendix not seen. No evidence of appendicitis. Lymphatic: No adenopathy. Reproductive: Uterus and bilateral adnexa are unremarkable. Other: No abdominal wall contusion or hernia. Physiologic pelvic free fluid. No hemoperitoneum. Musculoskeletal: No fracture.  No abnormality. IMPRESSION: VASCULAR 1. No vascular abnormality. NON-VASCULAR 1. Normal exam.  No evidence of injury to the abdomen or pelvis. Electronically Signed   By: Amie Portland M.D.   On: 08/30/2018 19:22    ____________________________________________   PROCEDURES  Procedure(s) performed:   Procedures   ____________________________________________   INITIAL IMPRESSION / ASSESSMENT AND PLAN / ED COURSE  Persistent pain right flank for multiple weeks is progressively worsening and episodes of syncope.  History of arteritis will CT scan to evaluate for same.  Also CT head that she hit her head and syncopized afterwards along with her left wrist secondary to trauma.  We will treat her pain for now however if CT and work-up is negative we will not further give narcotic pain medication.  Unclear etiology for her back pain but does have a wrist fracture.  Being treated.  She will follow-up with hand surgery.  Pertinent labs & imaging results that were available during my care of the patient were reviewed by me and considered in my medical decision making (see chart for details).  ____________________________________________  FINAL CLINICAL IMPRESSION(S) / ED DIAGNOSES  Final diagnoses:  Flank pain      MEDICATIONS GIVEN DURING THIS VISIT:  Medications  sodium chloride 0.9 % injection (has no administration in time range)  iopamidol (ISOVUE-370) 76 % injection (has no administration in time range)  HYDROmorphone (DILAUDID) injection 1 mg (1 mg Intravenous Given 08/30/18 1830)  lactated ringers bolus 1,000 mL (0 mLs Intravenous Stopped 08/30/18 2042)  iopamidol (ISOVUE-370) 76 % injection 100 mL (100 mLs Intravenous Contrast Given 08/30/18 1838)  HYDROmorphone (DILAUDID) injection 1 mg (1 mg Intravenous Given 08/30/18 2104)     NEW OUTPATIENT MEDICATIONS STARTED DURING THIS VISIT:  Discharge Medication List as of 08/30/2018  9:14 PM    START taking these medications   Details  ondansetron (ZOFRAN) 4 MG tablet Take 1 tablet (4 mg total) by mouth every 8 (eight) hours as needed for nausea or vomiting., Starting Tue 08/30/2018, Print    oxyCODONE-acetaminophen (PERCOCET) 5-325 MG tablet Take 1 tablet by mouth every 8 (eight) hours as needed for severe pain (in wrist)., Starting Tue 08/30/2018, Print  Note:  This note was prepared with assistance of Dragon voice recognition software. Occasional wrong-word or sound-a-like substitutions may have occurred due to the inherent limitations of voice recognition software.   Kaliel Bolds, Barbara Cower, MD 08/31/18 [phone]

## 2018-08-30 NOTE — ED Triage Notes (Signed)
Patient has a history of Takayasu's disease, coming of right flank and left wrist pain-fell off the top bunk today injuring wrist-given 100 mcg of Fentanyl IV given in route-CBG 127, BP 113/68, HR 86

## 2018-08-31 DIAGNOSIS — M25512 Pain in left shoulder: Secondary | ICD-10-CM | POA: Diagnosis not present

## 2018-09-08 DIAGNOSIS — S52502A Unspecified fracture of the lower end of left radius, initial encounter for closed fracture: Secondary | ICD-10-CM | POA: Diagnosis not present

## 2018-09-29 DIAGNOSIS — S52502D Unspecified fracture of the lower end of left radius, subsequent encounter for closed fracture with routine healing: Secondary | ICD-10-CM | POA: Diagnosis not present

## 2018-10-03 ENCOUNTER — Emergency Department (HOSPITAL_COMMUNITY)
Admission: EM | Admit: 2018-10-03 | Discharge: 2018-10-04 | Disposition: A | Discharge status: Home / Self Care | Payer: BLUE CROSS/BLUE SHIELD | Attending: Emergency Medicine | Admitting: Emergency Medicine

## 2018-10-03 ENCOUNTER — Ambulatory Visit (HOSPITAL_COMMUNITY)
Admission: EM | Admit: 2018-10-03 | Discharge: 2018-10-03 | Disposition: A | Discharge status: Home / Self Care | Payer: BLUE CROSS/BLUE SHIELD | Source: Home / Self Care

## 2018-10-03 ENCOUNTER — Other Ambulatory Visit: Payer: Self-pay

## 2018-10-03 ENCOUNTER — Telehealth: Payer: Self-pay | Admitting: *Deleted

## 2018-10-03 ENCOUNTER — Encounter (HOSPITAL_COMMUNITY): Payer: Self-pay | Admitting: Emergency Medicine

## 2018-10-03 ENCOUNTER — Ambulatory Visit: Payer: Self-pay | Admitting: *Deleted

## 2018-10-03 DIAGNOSIS — R110 Nausea: Secondary | ICD-10-CM | POA: Diagnosis not present

## 2018-10-03 DIAGNOSIS — Z79899 Other long term (current) drug therapy: Secondary | ICD-10-CM | POA: Insufficient documentation

## 2018-10-03 DIAGNOSIS — R1011 Right upper quadrant pain: Secondary | ICD-10-CM | POA: Diagnosis not present

## 2018-10-03 DIAGNOSIS — R4020 Unspecified coma: Secondary | ICD-10-CM | POA: Diagnosis not present

## 2018-10-03 DIAGNOSIS — G8929 Other chronic pain: Secondary | ICD-10-CM | POA: Diagnosis present

## 2018-10-03 DIAGNOSIS — M314 Aortic arch syndrome [Takayasu]: Secondary | ICD-10-CM | POA: Diagnosis present

## 2018-10-03 DIAGNOSIS — R109 Unspecified abdominal pain: Secondary | ICD-10-CM | POA: Diagnosis not present

## 2018-10-03 DIAGNOSIS — R1084 Generalized abdominal pain: Secondary | ICD-10-CM | POA: Diagnosis not present

## 2018-10-03 DIAGNOSIS — F419 Anxiety disorder, unspecified: Secondary | ICD-10-CM | POA: Diagnosis present

## 2018-10-03 DIAGNOSIS — R55 Syncope and collapse: Secondary | ICD-10-CM | POA: Diagnosis not present

## 2018-10-03 HISTORY — DX: Irritable bowel syndrome, unspecified: K58.9

## 2018-10-03 LAB — COMPREHENSIVE METABOLIC PANEL
ALT: 16 U/L (ref 0–44)
AST: 22 U/L (ref 15–41)
Albumin: 4.4 g/dL (ref 3.5–5.0)
Alkaline Phosphatase: 44 U/L (ref 38–126)
Anion gap: 7 (ref 5–15)
BILIRUBIN TOTAL: 0.7 mg/dL (ref 0.3–1.2)
BUN: 10 mg/dL (ref 6–20)
CALCIUM: 9.3 mg/dL (ref 8.9–10.3)
CO2: 20 mmol/L — AB (ref 22–32)
Chloride: 108 mmol/L (ref 98–111)
Creatinine, Ser: 0.84 mg/dL (ref 0.44–1.00)
GFR calc non Af Amer: >60 mL/min (ref >60)
Glucose, Bld: 97 mg/dL (ref 70–99)
Potassium: 3.6 mmol/L (ref 3.5–5.1)
Sodium: 135 mmol/L (ref 135–145)
TOTAL PROTEIN: 7.1 g/dL (ref 6.5–8.1)

## 2018-10-03 LAB — URINALYSIS, ROUTINE W REFLEX MICROSCOPIC
BILIRUBIN URINE: NEGATIVE
GLUCOSE, UA: NEGATIVE mg/dL
Hgb urine dipstick: NEGATIVE
KETONES UR: 5 mg/dL — AB
LEUKOCYTES UA: NEGATIVE
NITRITE: NEGATIVE
PH: 8 (ref 5.0–8.0)
Protein, ur: NEGATIVE mg/dL
Specific Gravity, Urine: 1.019 (ref 1.005–1.030)

## 2018-10-03 LAB — CBC
HCT: 42.1 % (ref 36.0–46.0)
Hemoglobin: 13.9 g/dL (ref 12.0–15.0)
MCH: 29.6 pg (ref 26.0–34.0)
MCHC: 33 g/dL (ref 30.0–36.0)
MCV: 89.8 fL (ref 80.0–100.0)
PLATELETS: 293 10*3/uL (ref 150–400)
RBC: 4.69 MIL/uL (ref 3.87–5.11)
RDW: 11.9 % (ref 11.5–15.5)
WBC: 7.9 10*3/uL (ref 4.0–10.5)
nRBC: 0 % (ref 0.0–0.2)

## 2018-10-03 LAB — POC URINE PREG, ED: Preg Test, Ur: NEGATIVE

## 2018-10-03 LAB — LIPASE, BLOOD: Lipase: 35 U/L (ref 11–51)

## 2018-10-03 MED ORDER — MORPHINE SULFATE (PF) 4 MG/ML IV SOLN
4.0000 mg | Freq: Once | INTRAVENOUS | Status: AC
  Administered 2018-10-04: 4 mg via INTRAVENOUS
  Filled 2018-10-03: qty 1

## 2018-10-03 MED ORDER — ONDANSETRON HCL 4 MG/2ML IJ SOLN
4.0000 mg | Freq: Once | INTRAMUSCULAR | Status: AC
  Administered 2018-10-04: 4 mg via INTRAVENOUS
  Filled 2018-10-03: qty 2

## 2018-10-03 NOTE — ED Provider Notes (Signed)
Patient placed in Quick Look pathway, seen and evaluated   Chief Complaint:  Abdominal pain  HPI: Kelsey Myers is a 22 y.o. female who presents to the ED via EMS with c/o abdominal pain. Patient reports she has chronic pain and then occasional flare up that brings her to the ED for pain control. Patient reports nausea without vomiting. Patient reports fever last night. Patient given Fentanyl and Zofran 4mg  by EMS in route to the ED.   ROS: GI: abdominal pain, nausea  Physical Exam:  BP (!) 110/94 (BP Location: Right Arm)   Pulse 89   Temp 98.2 F (36.8 C) (Oral)   Resp (!) 22   Ht 5\' 1"  (1.549 m)   Wt 48.5 kg   LMP 09/23/2018 (Exact Date)   SpO2 100%   BMI 20.22 kg/m    Gen: No distress  Neuro: Awake and Alert  Skin: Warm and dry  Abdomen: soft, generalized tenderness on palpation. Right flank tenderness on exam.   Initiation of care has begun. The patient has been counseled on the process, plan, and necessity for staying for the completion/evaluation, and the remainder of the medical screening examination    Janne Napoleon, NP 10/03/18 1533    Azalia Bilis, MD 10/03/18 [phone]

## 2018-10-03 NOTE — ED Provider Notes (Signed)
[facility] EMERGENCY DEPARTMENT Provider Note   CSN: [id number] Arrival date & time: 10/03/18  1510     History   Chief Complaint Chief Complaint  Patient presents with  . Abdominal Pain  . Flank Pain    HPI Kelsey Myers is a 22 y.o. female.  The history is provided by the patient.  She has history of Takayasu arteritis and comes in with right upper quadrant and flank pain for the last 2 days.  She has pains about once a month which usually comes either a week before or a week after her menses.  Pain is severe and she rates it at 8/10.  Yesterday, she had fever to 103, which prompted her to go to an urgent care center.  She had a syncopal episode at the urgent care center which she feels was related to her degree of pain.  She did have one episode of emesis yesterday but she is not having any ongoing nausea.  She has tried acetaminophen, ibuprofen, heat, ice without relief.  She has had extensive evaluation in the past including CT scan 1 month ago and ultrasound last year.  She is followed by rheumatologist and also a gastroenterologist.  She recently had her birth control pills adjusted.  She states that when this pain comes on it will last anywhere from 4 days to 1 week.  Past Medical History:  Diagnosis Date  . Anxiety   . Arteritis, Takayasu [facility])     Patient Active Problem List   Diagnosis Date Noted  . Anxiety   . Arteritis, Takayasu (HCC)   . Anxiety disorder, unspecified 03/07/2018  . Chronic right-sided low back pain without sciatica 10/20/2017  . Pain of upper abdomen 10/20/2017  . Takayasu's arteritis (HCC) 10/20/2017    History reviewed. No pertinent surgical history.   OB History   None      Home Medications    Prior to Admission medications   Medication Sig Start Date End Date Taking? Authorizing Provider  ibuprofen (ADVIL,MOTRIN) 200 MG tablet Take 600 mg by mouth every 6 (six) hours as needed for moderate pain.    Yes [provider]  JUNEL FE 1/20 1-20 MG-MCG tablet Take 1 tablet by mouth daily.    Yes [provider]  sertraline (ZOLOFT) 50 MG tablet Take 1 tablet (50 mg total) by mouth daily. 03/31/18  Yes Myles Lipps, MD  traZODone (DESYREL) 50 MG tablet Take 0.5-1 tablets (25-50 mg total) by mouth at bedtime as needed for sleep. Patient taking differently: Take 25 mg by mouth at bedtime as needed for sleep.  03/31/18  Yes Myles Lipps, MD  ondansetron (ZOFRAN) 4 MG tablet Take 1 tablet (4 mg total) by mouth every 8 (eight) hours as needed for nausea or vomiting. Patient not taking: Reported on 10/03/2018 08/30/18   Mesner, Barbara Cower, MD  oxyCODONE-acetaminophen (PERCOCET) 5-325 MG tablet Take 1 tablet by mouth every 8 (eight) hours as needed for severe pain (in wrist). Patient not taking: Reported on 10/03/2018 08/30/18   Mesner, Barbara Cower, MD    Family History Family History  Adopted: Yes  Problem Relation Age of Onset  . Healthy Mother   . Healthy Father   . Healthy Brother     Social History Social History   Tobacco Use  . Smoking status: Never Smoker  . Smokeless tobacco: Never Used  Substance Use Topics  . Alcohol use: No  . Drug use: No     Allergies  Patient has no known allergies.   Review of Systems Review of Systems  All other systems reviewed and are negative.    Physical Exam Updated Vital Signs BP (!) 141/94   Pulse 73   Temp 98.2 F (36.8 C) (Oral)   Resp 16   Ht 5\' 1"  (1.549 m)   Wt 48.5 kg   LMP 09/23/2018 (Exact Date)   SpO2 100%   BMI 20.22 kg/m   Physical Exam  Nursing note and vitals reviewed.  22 year old female, appears uncomfortable, but is in no acute distress. Vital signs are significant for mildly elevated blood pressure. Oxygen saturation is 100%, which is normal. Head is normocephalic and atraumatic. PERRLA, EOMI. Oropharynx is clear. Neck is nontender and supple without adenopathy or JVD. Back is nontender in the midline.  There is  mild right CVA tenderness. Lungs are clear without rales, wheezes, or rhonchi. Chest is nontender. Heart has regular rate and rhythm without murmur. Abdomen is soft, flat, with moderate epigastric and right upper quadrant tenderness.  There is no rebound or guarding.  There are no masses or hepatosplenomegaly and peristalsis is hypoactive. Extremities have no cyanosis or edema, full range of motion is present. Skin is warm and dry without rash. Neurologic: Mental status is normal, cranial nerves are intact, there are no motor or sensory deficits.  ED Treatments / Results  Labs (all labs ordered are listed, but only abnormal results are displayed) Labs Reviewed  COMPREHENSIVE METABOLIC PANEL - Abnormal; Notable for the following components:      Result Value   CO2 20 (*)    All other components within normal limits  URINALYSIS, ROUTINE W REFLEX MICROSCOPIC - Abnormal; Notable for the following components:   Color, Urine AMBER (*)    APPearance TURBID (*)    Ketones, ur 5 (*)    Bacteria, UA RARE (*)    All other components within normal limits  LIPASE, BLOOD  CBC  POC URINE PREG, ED    EKG EKG Interpretation  Date/Time:  Monday October 03 2018 15:35:58 EST Ventricular Rate:  74 PR Interval:  142 QRS Duration: 88 QT Interval:  376 QTC Calculation: 417 R Axis:   85 Text Interpretation:  Normal sinus rhythm Possible Left atrial enlargement Nonspecific ST abnormality Abnormal ECG When compared with ECG of 08/30/2018, No significant change was found Confirmed by Dione Booze ([id number]) on 10/03/2018 11:45:00 PM  Procedures Procedures   Medications Ordered in ED Medications  HYDROmorphone (DILAUDID) injection 1 mg (has no administration in time range)  morphine 4 MG/ML injection 4 mg (4 mg Intravenous Given 10/04/18 0043)  ondansetron (ZOFRAN) injection 4 mg (4 mg Intravenous Given 10/04/18 0043)  morphine 4 MG/ML injection 4 mg (4 mg Intravenous Given 10/04/18 0227)  morphine 4  MG/ML injection 4 mg (4 mg Intravenous Given 10/04/18 0337)  dicyclomine (BENTYL) capsule 20 mg (20 mg Oral Given 10/04/18 0337)  HYDROmorphone (DILAUDID) injection 1 mg (1 mg Intravenous Given 10/04/18 0502)  ketorolac (TORADOL) 30 MG/ML injection 30 mg (30 mg Intravenous Given 10/04/18 0502)  ondansetron (ZOFRAN) injection 4 mg (4 mg Intravenous Given 10/04/18 0501)  diphenhydrAMINE (BENADRYL) injection 25 mg (25 mg Intravenous Given 10/04/18 0548)     Initial Impression / Assessment and Plan / ED Course  I have reviewed the triage vital signs and the nursing notes.  Pertinent lab results that were available during my care of the patient were reviewed by me and considered in my medical decision making (see  chart for details).  Right upper quadrant and right flank pain of uncertain cause.  Old records are reviewed, and she was seen on October 1 for right flank pain and had CT angiogram of the abdomen and pelvis which was normal.  Labs today are reassuring.  I do not see any indication for further imaging.  She did have ultrasound of the right upper quadrant 1 year ago which showed no cholelithiasis.  At this point, I think the main concern is pain control and she will be given morphine and ondansetron.  Labs are reassuring.  No abnormalities of liver functions or renal function.  Normal WBC.  Patient failed to get adequate relief with above-noted treatment.  She is given additional morphine without relief of pain.  She was given hydromorphone plus oral dicyclomine, and still was not getting adequate pain relief.  She is given additional hydromorphone.  I do not feel that she is safe to go home because of inability to control pain.  Hospitalist has been paged to admit the patient for pain control.  Final Clinical Impressions(s) / ED Diagnoses   Final diagnoses:  RUQ pain    ED Discharge Orders    None       Dione Booze, MD 10/04/18 [phone]

## 2018-10-03 NOTE — ED Notes (Signed)
Sent patient urine to lab, along with a urine culture.

## 2018-10-03 NOTE — ED Notes (Signed)
Spoke with patient about her pain and symptoms, triage note from her PCP states she needs eval in ER. Per dr. Delton See, pt needs eval in ER. Pt with friend, will drive here there, agreeable to plan.

## 2018-10-03 NOTE — Telephone Encounter (Signed)
Patient phoned for an appointment for upper abdominal pain and lower right flank pain for  2 1/2 days with a high fever of 104.2 yesterday. The pain continues and is constant. Temp. Today is 100.oral. She did vomit x1 yesterday. She is drinking about 2 bottles of water daily. Denies any difficulty voiding and LBM yesterday was normal. Stated she was under the care of Rheumatologist Dr. Valentina Lucks at [facility] in [place]. Also saw GI in [place] one month ago for the same symptoms, HIDA SCAN was negative at that time. Reports last month with this same pain and fever she fell getting out of bed with dizziness and was seen at [facility] ER. CT at that time was negative. She is alternating tylenol and ibuprofen Q4 hours for the last 24 hours. Stated she has been diagnosed with Takayasu's Arteritis treated by Rheumatologist in [place].12/27/17 and 08/30/18 Ct's done.  Advised she seek treatment at the ER for abdominal pain and fever at this time. Routing this note to Dr. Leretha Pol. Unsure if she will seek ER treatment or not as she stated the last 2xs she sought treatment at [facility] ED, she felt the concentration was on treating the pain and not diagnosing the pain and fever. Protocol disposition is to see PCP within 24 hours. I will place her on Call Back for this afternoon to see if she sought care.  Reason for Disposition . Fever present > 3 days (72 hours)  Answer Assessment - Initial Assessment Questions 1. TEMPERATURE: "What is the most recent temperature?"  "How was it measured?"      At 8:00am it was 101 oral. 2. ONSET: "When did the fever start?"      Yesterday at 7pm. Temp was 104.2 oral 3. SYMPTOMS: "Do you have any other symptoms besides the fever?"  (e.g., colds, headache, sore throat, earache, cough, rash, diarrhea, vomiting, abdominal pain)     Stomach back lower right flank. Chronic pain for 4 years 4. CAUSE: If there are no symptoms, ask: "What do you think is causing the fever?"     Chronic disease 5.  CONTACTS: "Does anyone else in the family have an infection?"     no 6. TREATMENT: "What have you done so far to treat this fever?" (e.g., medications)     Tylenol 1000 mg alternating with ibuprofen 400 mg q4h 7. IMMUNOCOMPROMISE: "Do you have of the following: diabetes, HIV positive, splenectomy, cancer chemotherapy, chronic steroid treatment, transplant patient, etc."     [place] arteritis. 8. PREGNANCY: "Is there any chance you are pregnant?" "When was your last menstrual period?"    no 9. TRAVEL: "Have you traveled out of the country in the last month?" (e.g., travel history, exposures)     no  Protocols used: FEVER-A-AH

## 2018-10-03 NOTE — Telephone Encounter (Signed)
Seen in ER for pain control.

## 2018-10-03 NOTE — ED Triage Notes (Signed)
Pt to ED via [facility] with c/o RLQ pain radiating into right flank onset this am. Nausea without vomiting.  Pt st's elevated temp last pm.  Pt was given fentanyl Zofran 4mg  by EMS

## 2018-10-03 NOTE — ED Notes (Addendum)
Pt having tremors in lobby. Pt friend stated she had syncopal episode. PT stated they felt like they were going to pass out again. No urine collected at triage. Urine cup given to pt. Nurse notified

## 2018-10-03 NOTE — Telephone Encounter (Signed)
Copied from CRM [id number]. Topic: Quick Communication - See Telephone Encounter >> Oct 03, 2018  9:13 AM Aretta Nip wrote: CRM for notification. See Telephone encounter for: 10/03/18. PT called requesting to speak with Triage about an inflamation problem that is chronic- Warm transfer

## 2018-10-04 ENCOUNTER — Encounter (HOSPITAL_COMMUNITY): Payer: Self-pay | Admitting: Internal Medicine

## 2018-10-04 DIAGNOSIS — R109 Unspecified abdominal pain: Secondary | ICD-10-CM

## 2018-10-04 DIAGNOSIS — G8929 Other chronic pain: Secondary | ICD-10-CM | POA: Diagnosis present

## 2018-10-04 MED ORDER — ONDANSETRON HCL 4 MG/2ML IJ SOLN
4.0000 mg | Freq: Once | INTRAMUSCULAR | Status: AC
  Administered 2018-10-04: 4 mg via INTRAVENOUS
  Filled 2018-10-04: qty 2

## 2018-10-04 MED ORDER — DIPHENHYDRAMINE HCL 50 MG/ML IJ SOLN
25.0000 mg | Freq: Once | INTRAMUSCULAR | Status: AC
  Administered 2018-10-04: 25 mg via INTRAVENOUS
  Filled 2018-10-04: qty 1

## 2018-10-04 MED ORDER — MORPHINE SULFATE (PF) 4 MG/ML IV SOLN
4.0000 mg | Freq: Once | INTRAVENOUS | Status: AC
  Administered 2018-10-04: 4 mg via INTRAVENOUS
  Filled 2018-10-04: qty 1

## 2018-10-04 MED ORDER — HYDROMORPHONE HCL 1 MG/ML IJ SOLN
1.0000 mg | Freq: Once | INTRAMUSCULAR | Status: AC
  Administered 2018-10-04: 1 mg via INTRAVENOUS
  Filled 2018-10-04: qty 1

## 2018-10-04 MED ORDER — KETOROLAC TROMETHAMINE 30 MG/ML IJ SOLN
30.0000 mg | Freq: Once | INTRAMUSCULAR | Status: AC
  Administered 2018-10-04: 30 mg via INTRAVENOUS
  Filled 2018-10-04: qty 1

## 2018-10-04 MED ORDER — DICYCLOMINE HCL 10 MG PO CAPS
20.0000 mg | ORAL_CAPSULE | Freq: Once | ORAL | Status: AC
  Administered 2018-10-04: 20 mg via ORAL
  Filled 2018-10-04: qty 2

## 2018-10-04 MED ORDER — KETOROLAC TROMETHAMINE 10 MG PO TABS: 10.0000 mg | ORAL_TABLET | Freq: Four times a day (QID) | ORAL | 0 refills | Status: AC | PRN

## 2018-10-04 NOTE — Discharge Instructions (Signed)
Follow-up with GI medicine as set up by hospitalist.  Take the Toradol as directed for the pain.

## 2018-10-04 NOTE — ED Notes (Signed)
Patient with itching at this time.  Dr Preston Fleeting notified.  New orders per Dr Preston Fleeting.

## 2018-10-04 NOTE — Consult Note (Signed)
Consult Note   Kelsey Myers ZOX:[id number] DOB: [birth year] DOA: 10/03/2018  PCP:  [facility] Consultants:  Valentina Lucks - rheumatology, Vania Rea - GYN; Renee Rival - GI Patient coming from:  Home - lives with roommates in [place] (in college); from [place], lives with parents; [place]: Mother, [phone]  Chief Complaint: chronic abdominal pain  HPI: Kelsey Myers is a 22 y.o. female with medical history significant of takayasu arteritis presenting with abdominal pain.  She has been having chronic pain for about 5 years.  She was diagnosed with Takayasu's arteritis and is in remission but is having the same symptoms - extreme upper abdomen pain and flank pain.  She started with current flare up 2 days ago; she had a fever to 103 night before last through yesterday midday.  She was going to UC but she fainted at UC from the pain and they sent her here.  Now she is feeling fairly loopy, pain 8/10.  She has had ER visits for a similar presentation on: 08/04/18 and 08/30/18.  She has had an extensive evaluation by rheumatology, GI and GYN with CT and CTA which does not indicate arteritis.    Review of records shows a multitude of visits with various providers, most recently Dr. Renee Rival from [facility]eds [facility].  As of 1/18 (her last visit), she was reported to have had chronic abdominal pain since 2014 requiring multiple hospitalizations in 2014.  She was also diagnosed with IBS.  She appears to have been treated with Elavil with good result, although the patient now reports that this medication was ineffective.  She also reports having been on prednisone during her active Takayasu but reports that she has no need for this medication since she is not having an active flare.  ED Course:  Abdominal pain.  Negative workup.  Seen at [facility] in August and September for the same.  Unable to get pain controlled.    Review of Systems: As per HPI; otherwise review of systems reviewed and negative.   Ambulatory Status:   Ambulates without assistance  Past Medical History:  Diagnosis Date  . Anxiety   . Arteritis, Takayasu (HCC)   . IBS (irritable bowel syndrome)     History reviewed. No pertinent surgical history.  Social History   Socioeconomic History  . Marital status: Single    Spouse name: Not on file  . Number of children: Not on file  . Years of education: Not on file  . Highest education level: Not on file  Occupational History  . 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 Financial resource strain: Not on file  . Food insecurity:    Worry: Not on file    Inability: Not on file  . Transportation needs:    Medical: Not on file    Non-medical: Not on file  Tobacco Use  . Smoking status: Never Smoker  . Smokeless tobacco: Never Used  Substance and Sexual Activity  . Alcohol use: No  . Drug use: No  . Sexual activity: Yes  Lifestyle  . Physical activity:    Days per week: Not on file    Minutes per session: Not on file  . Stress: Not on file  Relationships  . Social connections:    Talks on phone: Not on file    Gets together: Not on file    Attends religious service: Not on file    Active member of club or organization: Not on file    Attends meetings  of clubs or organizations: Not on file    Relationship status: Not on file  . Intimate partner violence:    Fear of current or ex partner: Not on file    Emotionally abused: Not on file    Physically abused: Not on file    Forced sexual activity: Not on file  Other Topics Concern  . Not on file  Social History Narrative  . Not on file    No Known Allergies  Family History  Adopted: Yes  Problem Relation Age of Onset  . Healthy Mother   . Healthy Father   . Healthy Brother     Prior to Admission medications   Medication Sig Start Date End Date Taking? Authorizing Provider  ibuprofen (ADVIL,MOTRIN) 200 MG tablet Take 600 mg by mouth every 6 (six) hours as needed for moderate pain.    Yes  [provider]  JUNEL FE 1/20 1-20 MG-MCG tablet Take 1 tablet by mouth daily.    Yes [provider]  sertraline (ZOLOFT) 50 MG tablet Take 1 tablet (50 mg total) by mouth daily. 03/31/18  Yes Myles Lipps, MD  traZODone (DESYREL) 50 MG tablet Take 0.5-1 tablets (25-50 mg total) by mouth at bedtime as needed for sleep. Patient taking differently: Take 25 mg by mouth at bedtime as needed for sleep.  03/31/18  Yes Myles Lipps, MD  ondansetron (ZOFRAN) 4 MG tablet Take 1 tablet (4 mg total) by mouth every 8 (eight) hours as needed for nausea or vomiting. Patient not taking: Reported on 10/03/2018 08/30/18   Mesner, Barbara Cower, MD  oxyCODONE-acetaminophen (PERCOCET) 5-325 MG tablet Take 1 tablet by mouth every 8 (eight) hours as needed for severe pain (in wrist). Patient not taking: Reported on 10/03/2018 08/30/18   Marily Memos, MD    Physical Exam: Vitals:   10/04/18 0700 10/04/18 0715 10/04/18 0730 10/04/18 0745  BP: 113/72 109/67 115/64 109/62  Pulse: (!) 54 73 (!) 51 66  Resp: 20 18 12 16   Temp:      TempSrc:      SpO2: 98% 99% 99% 98%  Weight:      Height:         General:  Appears calm and comfortable and is NAD Eyes:  PERRL, EOMI, normal lids, iris ENT:  grossly normal hearing, lips & tongue, mmm; appropriate dentition Neck:  no LAD, masses or thyromegaly; no carotid bruits Cardiovascular:  RRR, no m/r/g. No LE edema.  Respiratory:   CTA bilaterally with no wheezes/rales/rhonchi.  Normal respiratory effort. Abdomen:  soft, NT, ND, NABS Back:   normal alignment, no CVAT Skin:  no rash or induration seen on limited exam Musculoskeletal:  grossly normal tone BUE/BLE, good ROM, no bony abnormality Lower extremity:  No LE edema.  Limited foot exam with no ulcerations.  2+ distal pulses. Psychiatric:  grossly normal mood and affect, speech fluent and appropriate, AOx3; she did become emotionally distraught when informed that hospital admission did not appear to be  indicated Neurologic:  CN 2-12 grossly intact, moves all extremities in coordinated fashion, sensation intact    Radiological Exams on Admission: No results found.  EKG: Independently reviewed.  NSR with rate 74; nonspecific ST changes with no evidence of acute ischemia - NSCSLT   Labs on Admission: I have personally reviewed the available labs and imaging studies at the time of the admission.  Pertinent labs:   CO2 20 CMP otherwise WNL Normal CBC UA: 5 ketones, rare bacteria Negative upreg  10/1 UDS negative  Assessment/Plan Principal Problem:   Chronic abdominal pain Active Problems:   Takayasu's arteritis (HCC)   Anxiety disorder, unspecified   -Patient with chronic abdominal pain dating at least through 2014 -While there may have been some indication of an autoimmune condition in the past, there is currently no apparent flare; review of prior studies dating back to 2014 indicates a multitude of CT/MRI studies with comment on MRI abdomen 11/20/13 with "minimal elevated T2 signal ... Unclear if this reflects and vague minor symptoms of Takayasu's in this setting."; MRI chest 03/02/14 with "descending thoracic aortitis has improved since 2014, although aortic wall thickening persists."'; negative pelvic US 08/2014; CTA chest 09/29/17 with "trace descending thoracic aortic wall thickening..., likely sequelae of prior vasculitis"; limited negative pelvic US with dopplers in 11/18; negative RUQ [place] in 11/18; negative CT A/P in 1/19; negative head CT in 10/19; and normal CTA A/P in 10/19. -Today's vitals and labs are essentially normal. -Given negative evaluation, admission does not appear to be warranted at this time. -While pain control is ideal, treating pain with opioid medication in the absence of clear pathology is a very slippery slope - particularly in such a young woman -The patient was quite distraught with the idea of not being admitted, since she desired a GI consult -Based on  this clear goal for her, I called and arranged an outpatient appointment with Dr. Levora Angel at [facility] for tomorrow AM at 0900. -I went back to report this appointment to the patient, who voiced concern for ongoing pain issues, causing her to "pass out" -I discussed the patient with Dr. Deretha Emory and encouraged him to consider use of PO Toradol for 5 days for pain control - she does not have apparent contraindications to use of this medication and it may be a reasonable short-term option for pain control -I further discussed options including a steroid taper and resumed Elavil for other pain options -The patient then requested that I call her mother (who is in [place]) to discuss -Her mother and I had a prolonged discussion about why it is suboptimal to initiate chronic opioid therapy in this patient; seeking alternative pain control options such as accupuncture and even hypnosis; about how prior GYN evaluation indicated a low suspicion for endometriosis as the cause for her chronic pain; and even about how this is negatively impacting the patient's psyche, confidence, and grades.  We discussed how it is important to maintain continuity with her physicians so that the work-up does not have to begin anew every year or two.  We discussed that the patient may need a leave of absence from school if she has failing grades associated with ongoing pain issues.   -Overall, I strongly encouraged avoidance of opioid medication as a short- or long-term therapy for her chronic pain, as this may create an entirely new or complicated problem - including opioid dependency and constipation, as well as risk of overdose. -While the patient and her mother were not thrilled to not have the answers they are seeking, I also strove to explain that chronic pain over 5 years' duration is often not solved overnight. -With her normal findings to date, there does not appear to be an indication for admission to the hospital at this  time; ultimately, the patient's mother appeared to agree with this conclusion.   Thank you for the consult on this interesting patient.  I hope that she is able to find (non-opioid-related) answers to her problem moving forward in  the outpatient setting.  Time dedicated to this consult exceeded 90 minutes, including time with the patient, discussion with consultants/patient's mother, and chart review.     Jonah Blue MD [facility]  If note is complete, please contact covering daytime or nighttime physician. [email or web address] Password [facility]  10/04/2018, 9:44 AM

## 2018-10-04 NOTE — ED Provider Notes (Signed)
Patient seen by the [facility].  They counseled patient on the pain control chronic pain control.  Did not want to go with narcotics set up an arrangement that patient follow-up with GI medicine.  Hospitalist saw the patient in consultation and have recommended discharge home.  They did speak with the patient's family as well.   Vanetta Mulders, MD 10/04/18 1017

## 2018-10-05 DIAGNOSIS — R1084 Generalized abdominal pain: Secondary | ICD-10-CM | POA: Diagnosis not present

## 2018-10-17 DIAGNOSIS — M533 Sacrococcygeal disorders, not elsewhere classified: Secondary | ICD-10-CM | POA: Diagnosis not present

## 2018-10-17 DIAGNOSIS — M545 Low back pain: Secondary | ICD-10-CM | POA: Diagnosis not present

## 2018-10-17 DIAGNOSIS — M791 Myalgia, unspecified site: Secondary | ICD-10-CM | POA: Diagnosis not present

## 2018-10-17 DIAGNOSIS — M25551 Pain in right hip: Secondary | ICD-10-CM | POA: Diagnosis not present

## 2018-10-20 DIAGNOSIS — R102 Pelvic and perineal pain: Secondary | ICD-10-CM | POA: Diagnosis not present

## 2018-10-20 DIAGNOSIS — G8929 Other chronic pain: Secondary | ICD-10-CM | POA: Diagnosis not present

## 2018-10-22 DIAGNOSIS — M545 Low back pain: Secondary | ICD-10-CM | POA: Diagnosis not present

## 2018-10-22 DIAGNOSIS — M533 Sacrococcygeal disorders, not elsewhere classified: Secondary | ICD-10-CM | POA: Diagnosis not present

## 2018-10-25 DIAGNOSIS — M4690 Unspecified inflammatory spondylopathy, site unspecified: Secondary | ICD-10-CM | POA: Diagnosis not present

## 2018-11-02 DIAGNOSIS — G8929 Other chronic pain: Secondary | ICD-10-CM | POA: Diagnosis not present

## 2018-11-02 DIAGNOSIS — R102 Pelvic and perineal pain: Secondary | ICD-10-CM | POA: Diagnosis not present

## 2018-11-07 ENCOUNTER — Other Ambulatory Visit: Payer: Self-pay | Admitting: Family Medicine

## 2018-11-07 DIAGNOSIS — F411 Generalized anxiety disorder: Secondary | ICD-10-CM | POA: Diagnosis not present

## 2018-11-07 DIAGNOSIS — F410 Panic disorder [episodic paroxysmal anxiety] without agoraphobia: Secondary | ICD-10-CM | POA: Diagnosis not present

## 2018-11-07 DIAGNOSIS — F332 Major depressive disorder, recurrent severe without psychotic features: Secondary | ICD-10-CM | POA: Diagnosis not present

## 2018-11-08 NOTE — Telephone Encounter (Signed)
Pt called in regards to refill request and scheduling a f/u appt. Pt states she is seeing a psychiatrist in [place] and refill request for Sertraline can be cancelled.

## 2018-11-10 DIAGNOSIS — M533 Sacrococcygeal disorders, not elsewhere classified: Secondary | ICD-10-CM | POA: Diagnosis not present

## 2018-11-10 DIAGNOSIS — M791 Myalgia, unspecified site: Secondary | ICD-10-CM | POA: Diagnosis not present

## 2018-11-10 DIAGNOSIS — M25551 Pain in right hip: Secondary | ICD-10-CM | POA: Diagnosis not present

## 2018-11-10 DIAGNOSIS — M545 Low back pain: Secondary | ICD-10-CM | POA: Diagnosis not present

## 2018-12-01 DIAGNOSIS — H66001 Acute suppurative otitis media without spontaneous rupture of ear drum, right ear: Secondary | ICD-10-CM | POA: Diagnosis not present

## 2018-12-05 DIAGNOSIS — F332 Major depressive disorder, recurrent severe without psychotic features: Secondary | ICD-10-CM | POA: Diagnosis not present

## 2018-12-05 DIAGNOSIS — Z5181 Encounter for therapeutic drug level monitoring: Secondary | ICD-10-CM | POA: Diagnosis not present

## 2018-12-05 DIAGNOSIS — F329 Major depressive disorder, single episode, unspecified: Secondary | ICD-10-CM | POA: Diagnosis not present

## 2018-12-05 DIAGNOSIS — Z09 Encounter for follow-up examination after completed treatment for conditions other than malignant neoplasm: Secondary | ICD-10-CM | POA: Diagnosis not present

## 2018-12-05 DIAGNOSIS — Z915 Personal history of self-harm: Secondary | ICD-10-CM | POA: Diagnosis not present

## 2018-12-05 DIAGNOSIS — F411 Generalized anxiety disorder: Secondary | ICD-10-CM | POA: Diagnosis not present

## 2018-12-05 DIAGNOSIS — F410 Panic disorder [episodic paroxysmal anxiety] without agoraphobia: Secondary | ICD-10-CM | POA: Diagnosis not present

## 2018-12-05 DIAGNOSIS — Z79899 Other long term (current) drug therapy: Secondary | ICD-10-CM | POA: Diagnosis not present

## 2019-01-22 IMAGING — CR DG WRIST COMPLETE 3+V*L*
4 series · 4 of 4 positions shown · non-contrast
Comparison: None.

CLINICAL DATA: left wrist pain-fell off the top bunk today injuring
wrist

EXAM:
LEFT WRIST - COMPLETE 3+ VIEW

[x wrist pa left]
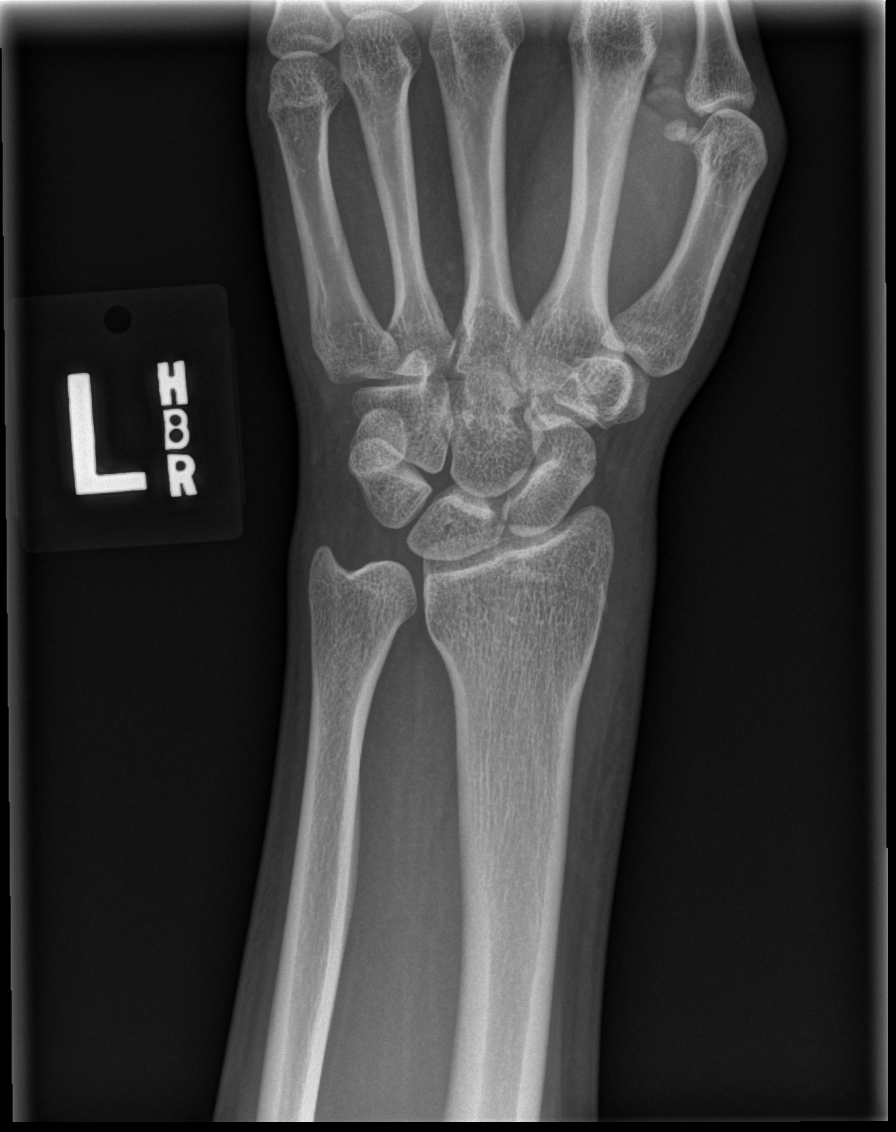

[x wrist obl left]
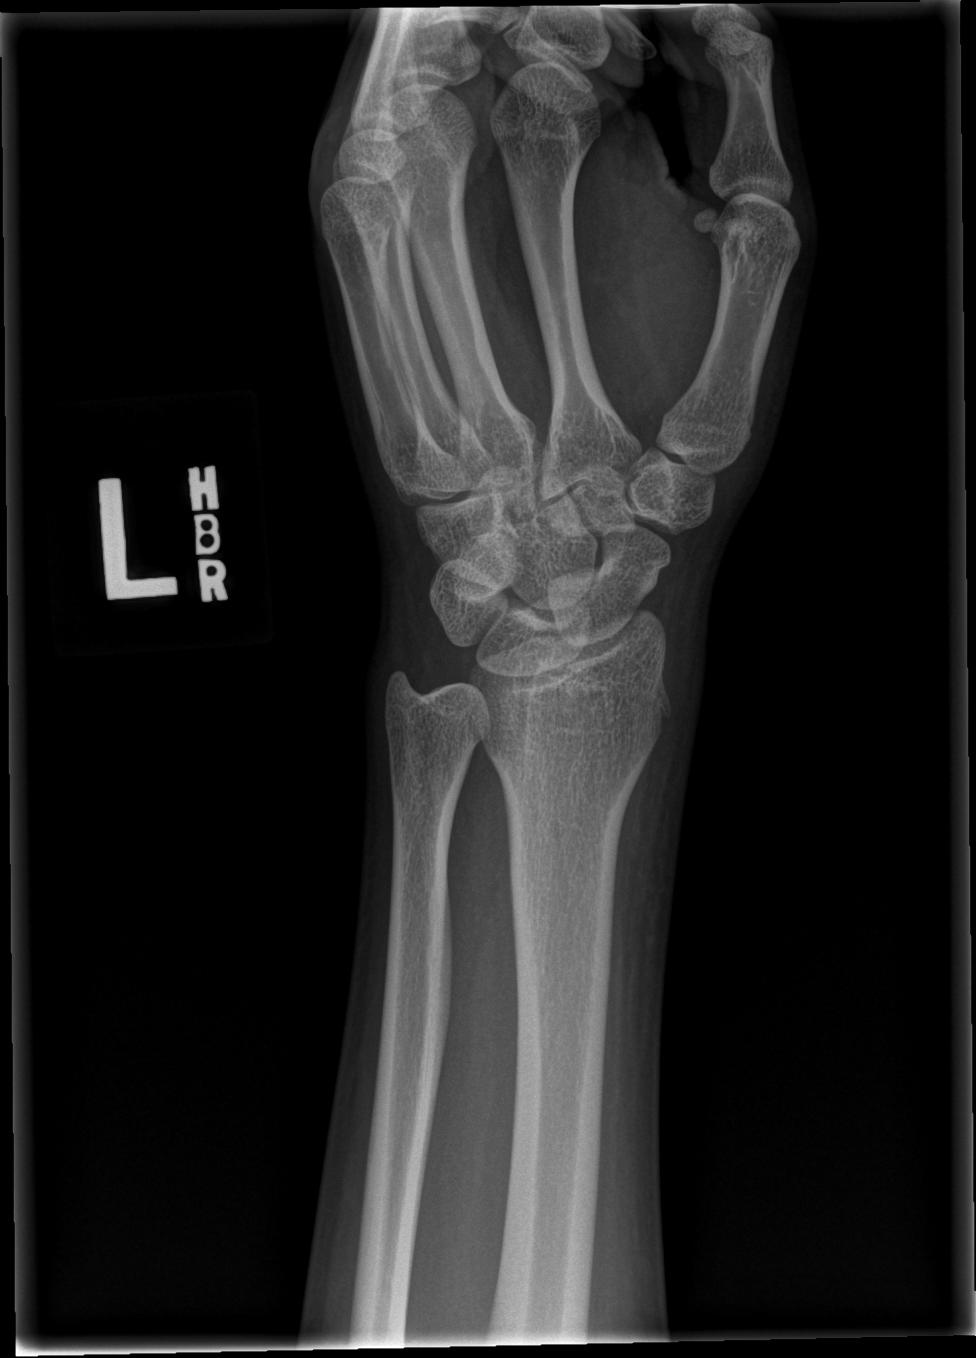

[x wrist lat left]
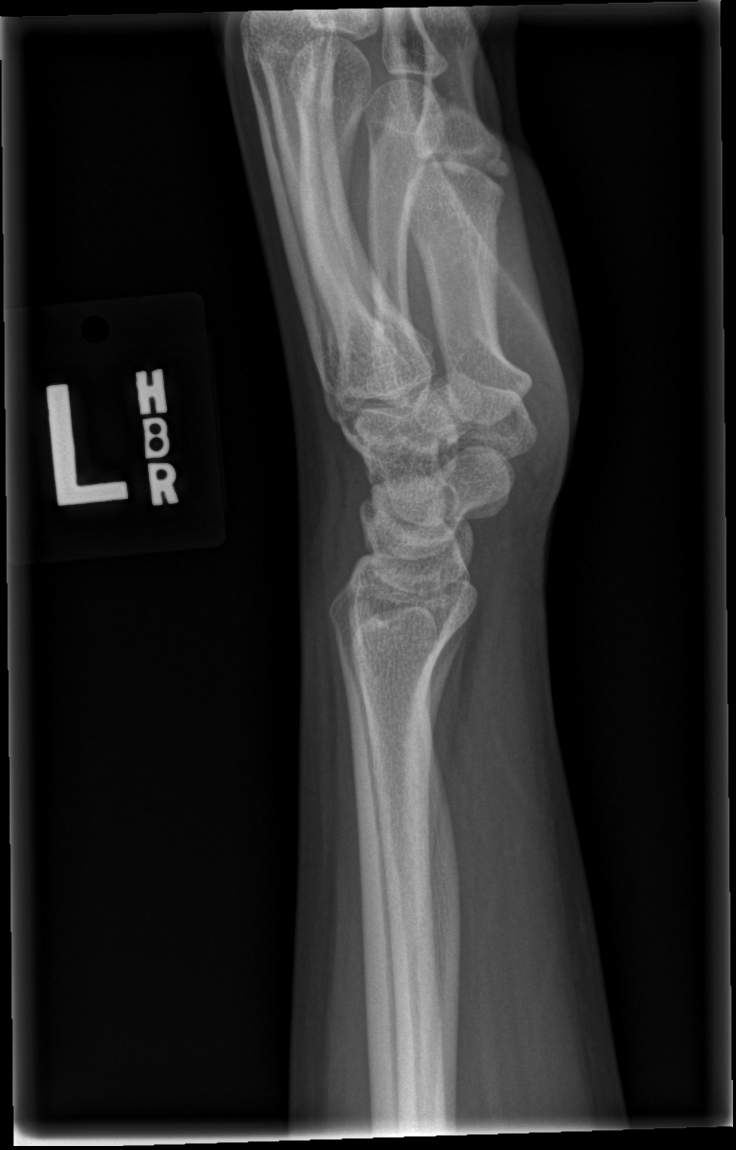

[x wrist navicular view left]
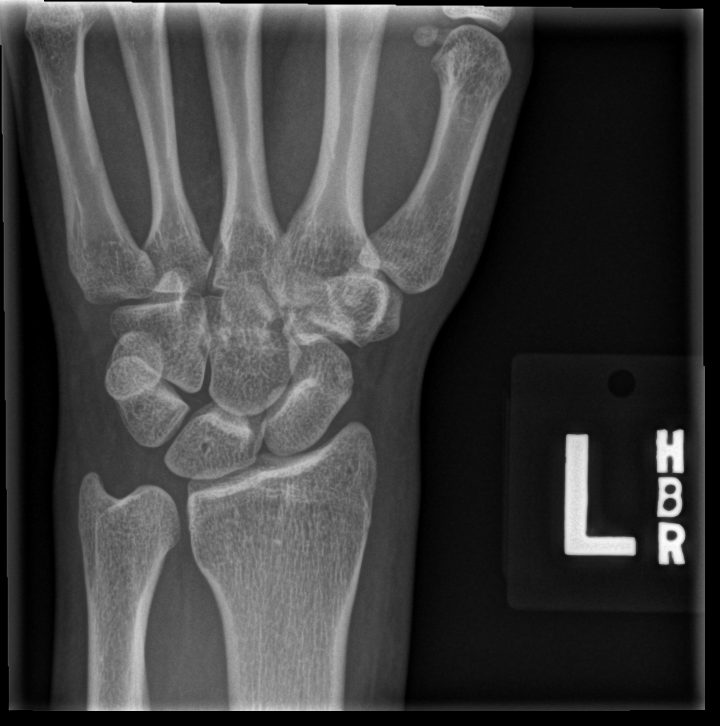

[4 of 4 positions shown; findings below may reference images not displayed]

FINDINGS: There is evidence of a nondisplaced fracture at the base of the
radial styloid, most convincing along the oblique view.

No other evidence of a fracture.

Wrist joints are normally spaced and aligned.

Soft tissues are unremarkable.
IMPRESSION: 1. Probable nondisplaced fracture at the base of the radial styloid.
No other fractures. No dislocation.

## 2019-01-22 IMAGING — CT CT HEAD W/O CM
3 series · 16 of 47 positions shown, 19 images · non-contrast
Comparison: None.

CLINICAL DATA: Head trauma headache

EXAM:
CT HEAD WITHOUT CONTRAST
TECHNIQUE: Contiguous axial images were obtained from the base of the skull
through the vertex without intravenous contrast.

[Series 2: head wo · axial · 0.44mm/px · z∈[+389,+514]mm · 10 of 31 slices shown, 13 images]
[im 3/31  brain]
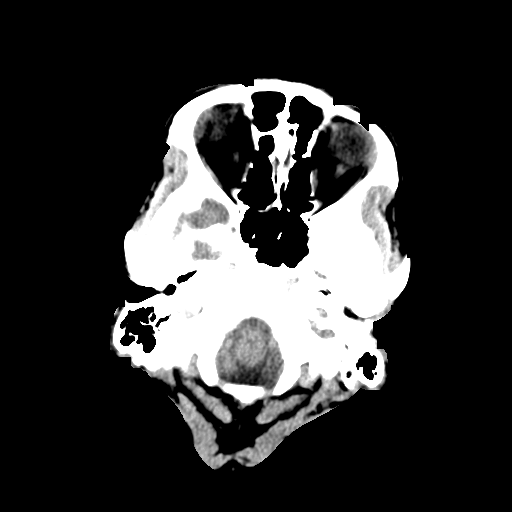
[im 3/31  bone]
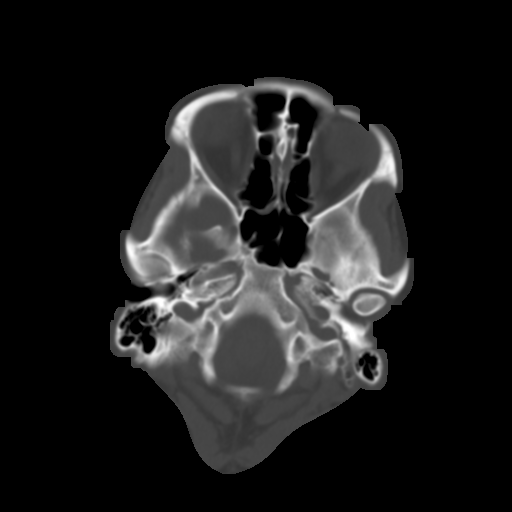
[im 6/31  brain]
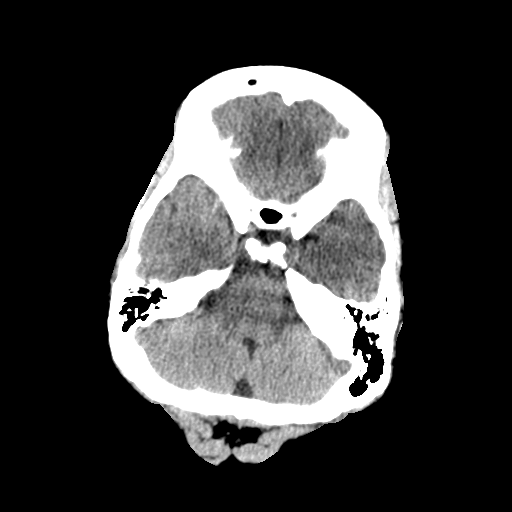
[im 9/31  brain]
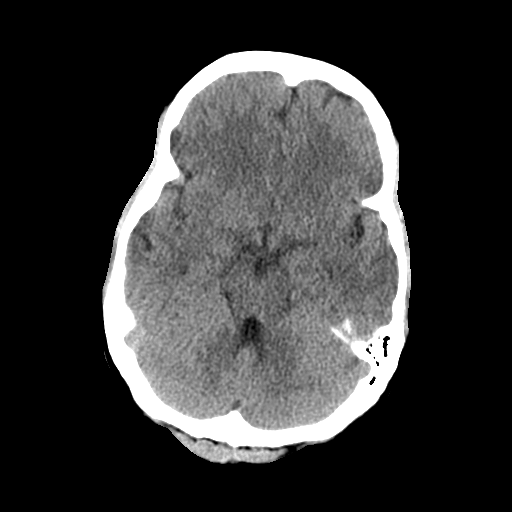
[im 11/31  brain]
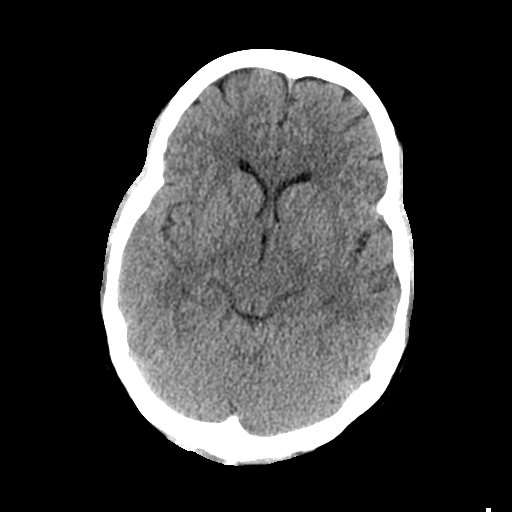
[im 14/31  brain]
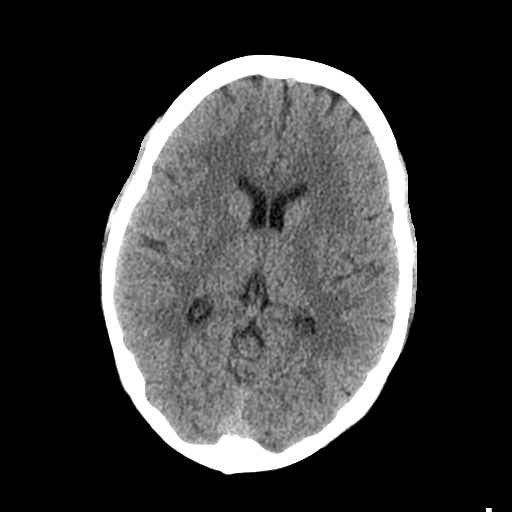
[im 14/31  bone]
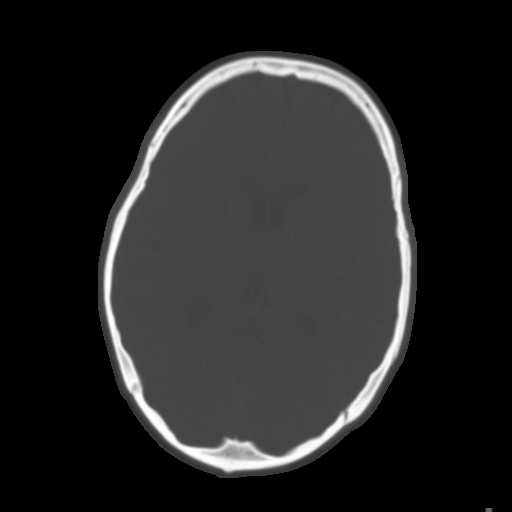
[im 17/31  brain]
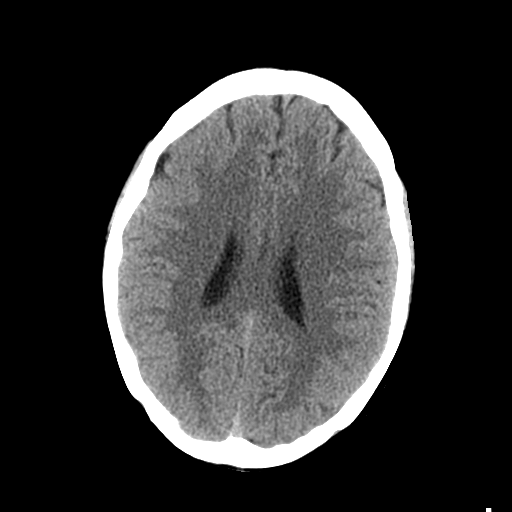
[im 20/31  brain]
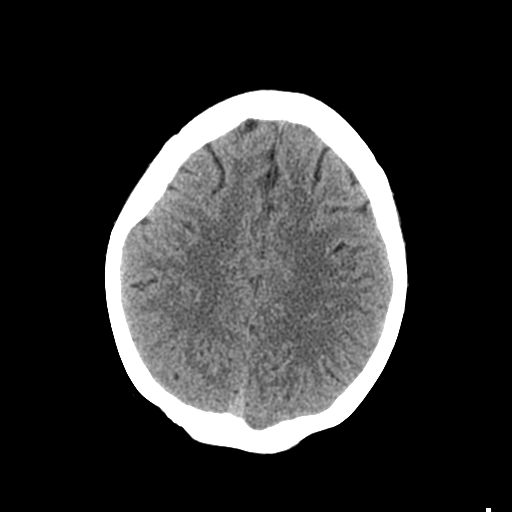
[im 23/31  brain]
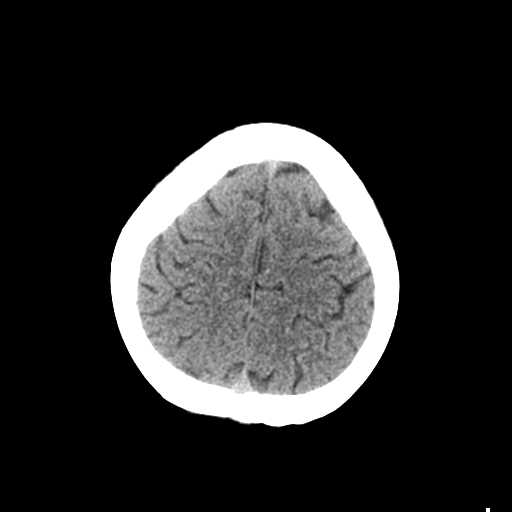
[im 25/31  brain]
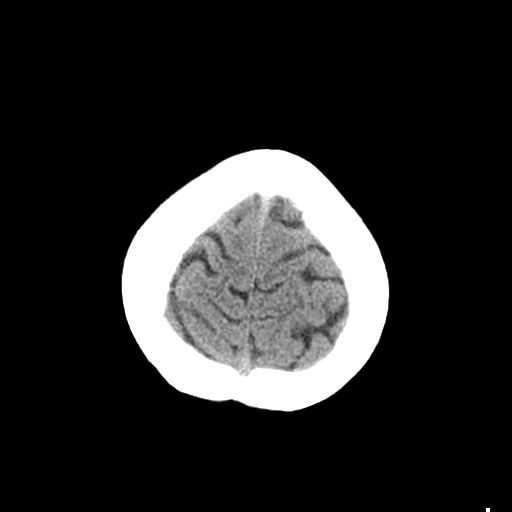
[im 25/31  bone]
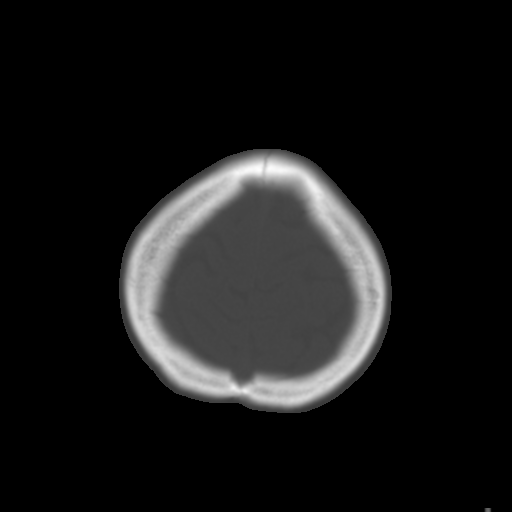
[im 28/31  brain]
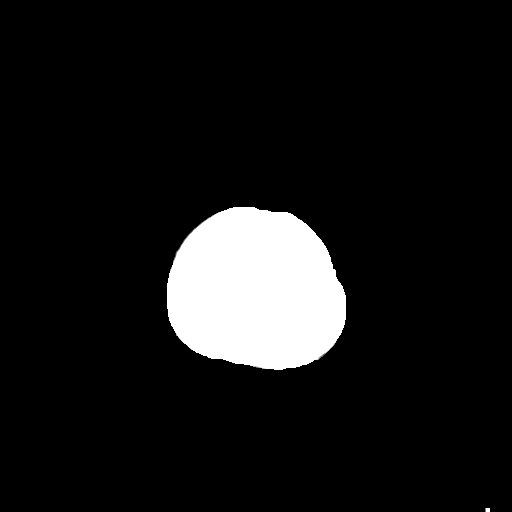

[Series 5: coronal soft tissue · coronal · 0.30mm/px · 3 of 66 slices shown]
[im 22/66  brain]
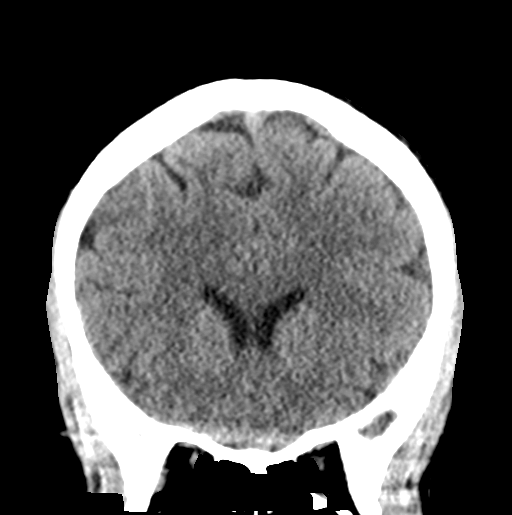
[im 29/66  brain]
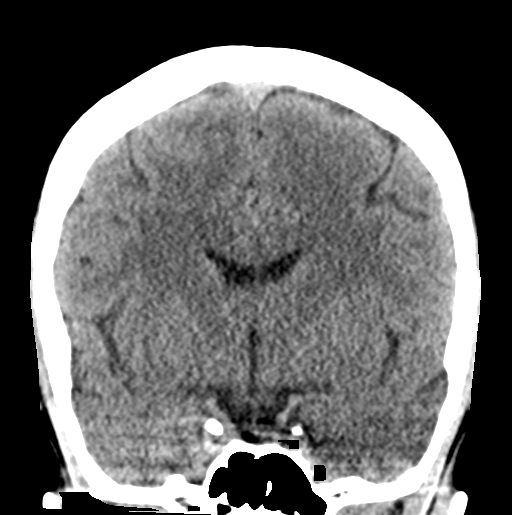
[im 37/66  brain]
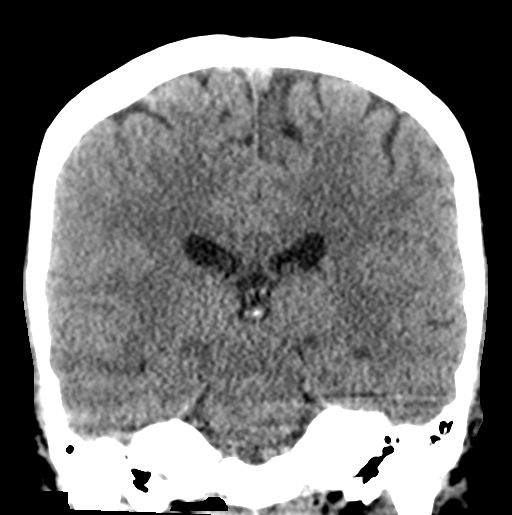

[Series 6: sagittal soft tissue · sagittal · 0.30mm/px · 3 of 51 slices shown]
[im 17/51  brain]
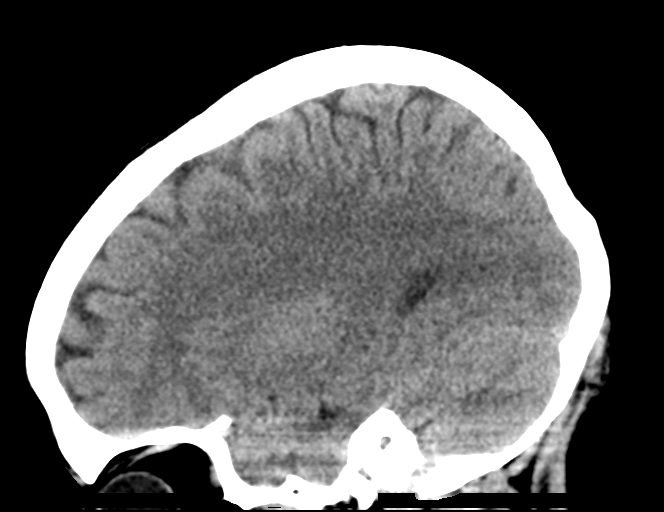
[im 26/51  brain]
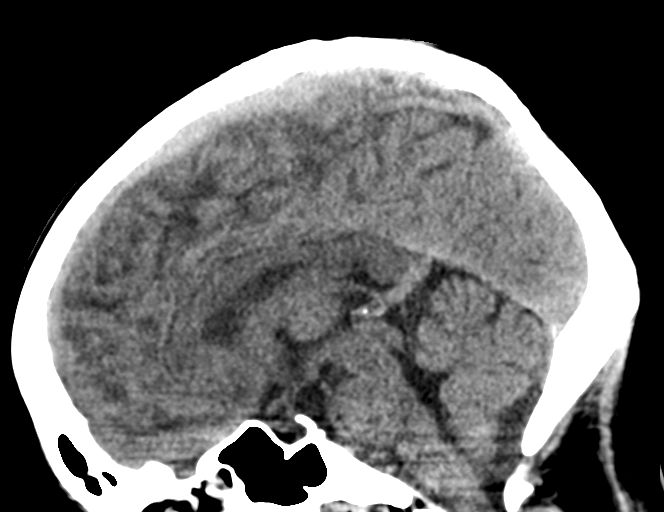
[im 34/51  brain]
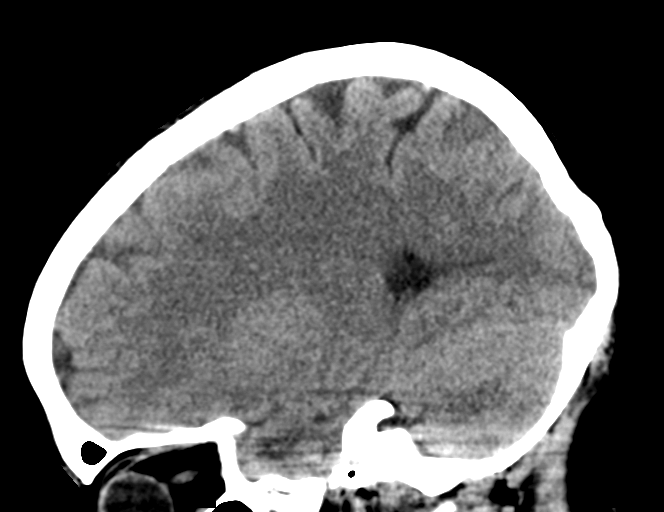

[16 of 47 positions shown; findings below may reference images not displayed]

FINDINGS: Brain: No evidence of acute infarction, hemorrhage, hydrocephalus,
extra-axial collection or mass lesion/mass effect.

Vascular: No hyperdense vessel or unexpected calcification.

Skull: Normal. Negative for fracture or focal lesion.

Sinuses/Orbits: No acute finding.

Other: None
IMPRESSION: Negative non contrasted CT appearance of the brain

## 2019-06-28 DIAGNOSIS — F3340 Major depressive disorder, recurrent, in remission, unspecified: Secondary | ICD-10-CM | POA: Diagnosis not present

## 2019-06-28 DIAGNOSIS — F410 Panic disorder [episodic paroxysmal anxiety] without agoraphobia: Secondary | ICD-10-CM | POA: Diagnosis not present

## 2019-08-03 DIAGNOSIS — F329 Major depressive disorder, single episode, unspecified: Secondary | ICD-10-CM | POA: Diagnosis not present

## 2019-09-08 DIAGNOSIS — F329 Major depressive disorder, single episode, unspecified: Secondary | ICD-10-CM | POA: Diagnosis not present

## 2019-09-12 DIAGNOSIS — Z03818 Encounter for observation for suspected exposure to other biological agents ruled out: Secondary | ICD-10-CM | POA: Diagnosis not present

## 2019-09-12 DIAGNOSIS — F39 Unspecified mood [affective] disorder: Secondary | ICD-10-CM | POA: Diagnosis not present

## 2019-09-12 DIAGNOSIS — Z20828 Contact with and (suspected) exposure to other viral communicable diseases: Secondary | ICD-10-CM | POA: Diagnosis not present

## 2019-09-12 DIAGNOSIS — F410 Panic disorder [episodic paroxysmal anxiety] without agoraphobia: Secondary | ICD-10-CM | POA: Diagnosis not present

## 2019-09-15 DIAGNOSIS — F319 Bipolar disorder, unspecified: Secondary | ICD-10-CM | POA: Diagnosis not present

## 2019-09-25 DIAGNOSIS — F39 Unspecified mood [affective] disorder: Secondary | ICD-10-CM | POA: Diagnosis not present

## 2019-09-25 DIAGNOSIS — Z20828 Contact with and (suspected) exposure to other viral communicable diseases: Secondary | ICD-10-CM | POA: Diagnosis not present

## 2019-09-25 DIAGNOSIS — F4001 Agoraphobia with panic disorder: Secondary | ICD-10-CM | POA: Diagnosis not present

## 2019-09-29 DIAGNOSIS — F39 Unspecified mood [affective] disorder: Secondary | ICD-10-CM | POA: Diagnosis not present

## 2019-11-01 DIAGNOSIS — F410 Panic disorder [episodic paroxysmal anxiety] without agoraphobia: Secondary | ICD-10-CM | POA: Diagnosis not present

## 2019-11-01 DIAGNOSIS — F39 Unspecified mood [affective] disorder: Secondary | ICD-10-CM | POA: Diagnosis not present

## 2019-11-28 DIAGNOSIS — N930 Postcoital and contact bleeding: Secondary | ICD-10-CM | POA: Diagnosis not present

## 2019-11-28 DIAGNOSIS — N941 Unspecified dyspareunia: Secondary | ICD-10-CM | POA: Diagnosis not present

## 2019-11-28 DIAGNOSIS — Z124 Encounter for screening for malignant neoplasm of cervix: Secondary | ICD-10-CM | POA: Diagnosis not present

## 2019-11-28 DIAGNOSIS — N939 Abnormal uterine and vaginal bleeding, unspecified: Secondary | ICD-10-CM | POA: Diagnosis not present

## 2019-11-28 DIAGNOSIS — R87610 Atypical squamous cells of undetermined significance on cytologic smear of cervix (ASC-US): Secondary | ICD-10-CM | POA: Diagnosis not present

## 2019-12-06 DIAGNOSIS — Z041 Encounter for examination and observation following transport accident: Secondary | ICD-10-CM | POA: Diagnosis not present

## 2019-12-06 DIAGNOSIS — M549 Dorsalgia, unspecified: Secondary | ICD-10-CM | POA: Diagnosis not present

## 2019-12-06 DIAGNOSIS — M542 Cervicalgia: Secondary | ICD-10-CM | POA: Diagnosis not present

## 2019-12-06 DIAGNOSIS — S161XXA Strain of muscle, fascia and tendon at neck level, initial encounter: Secondary | ICD-10-CM | POA: Diagnosis not present

## 2019-12-25 DIAGNOSIS — Z03818 Encounter for observation for suspected exposure to other biological agents ruled out: Secondary | ICD-10-CM | POA: Diagnosis not present

## 2019-12-26 DIAGNOSIS — F39 Unspecified mood [affective] disorder: Secondary | ICD-10-CM | POA: Diagnosis not present

## 2019-12-26 DIAGNOSIS — F410 Panic disorder [episodic paroxysmal anxiety] without agoraphobia: Secondary | ICD-10-CM | POA: Diagnosis not present

## 2020-01-31 DIAGNOSIS — F410 Panic disorder [episodic paroxysmal anxiety] without agoraphobia: Secondary | ICD-10-CM | POA: Diagnosis not present

## 2020-01-31 DIAGNOSIS — F39 Unspecified mood [affective] disorder: Secondary | ICD-10-CM | POA: Diagnosis not present

## 2020-04-03 DIAGNOSIS — F39 Unspecified mood [affective] disorder: Secondary | ICD-10-CM | POA: Diagnosis not present

## 2020-04-03 DIAGNOSIS — F410 Panic disorder [episodic paroxysmal anxiety] without agoraphobia: Secondary | ICD-10-CM | POA: Diagnosis not present

## 2020-05-09 DIAGNOSIS — M79641 Pain in right hand: Secondary | ICD-10-CM | POA: Diagnosis not present

## 2020-05-13 DIAGNOSIS — M25521 Pain in right elbow: Secondary | ICD-10-CM | POA: Diagnosis not present

## 2020-05-13 DIAGNOSIS — M79641 Pain in right hand: Secondary | ICD-10-CM | POA: Diagnosis not present

## 2020-05-13 DIAGNOSIS — M25531 Pain in right wrist: Secondary | ICD-10-CM | POA: Diagnosis not present

## 2020-05-13 DIAGNOSIS — S60211A Contusion of right wrist, initial encounter: Secondary | ICD-10-CM | POA: Diagnosis not present

## 2020-05-28 DIAGNOSIS — M79641 Pain in right hand: Secondary | ICD-10-CM | POA: Diagnosis not present

## 2020-05-28 DIAGNOSIS — M25531 Pain in right wrist: Secondary | ICD-10-CM | POA: Diagnosis not present

## 2020-06-18 DIAGNOSIS — M25531 Pain in right wrist: Secondary | ICD-10-CM | POA: Diagnosis not present

## 2020-06-18 DIAGNOSIS — M79641 Pain in right hand: Secondary | ICD-10-CM | POA: Diagnosis not present

## 2020-10-03 DIAGNOSIS — F4001 Agoraphobia with panic disorder: Secondary | ICD-10-CM | POA: Diagnosis not present

## 2020-10-03 DIAGNOSIS — F39 Unspecified mood [affective] disorder: Secondary | ICD-10-CM | POA: Diagnosis not present

## 2020-10-14 DIAGNOSIS — Z01419 Encounter for gynecological examination (general) (routine) without abnormal findings: Secondary | ICD-10-CM | POA: Diagnosis not present

## 2020-10-14 DIAGNOSIS — Z124 Encounter for screening for malignant neoplasm of cervix: Secondary | ICD-10-CM | POA: Diagnosis not present

## 2021-05-05 ENCOUNTER — Emergency Department (HOSPITAL_COMMUNITY)
Admission: EM | Admit: 2021-05-05 | Discharge: 2021-05-06 | Disposition: A | Discharge status: Home / Self Care | Payer: BC Managed Care – PPO | Attending: Emergency Medicine | Admitting: Emergency Medicine

## 2021-05-05 ENCOUNTER — Other Ambulatory Visit: Payer: Self-pay

## 2021-05-05 ENCOUNTER — Emergency Department (HOSPITAL_COMMUNITY): Payer: BC Managed Care – PPO

## 2021-05-05 ENCOUNTER — Encounter (HOSPITAL_COMMUNITY): Payer: Self-pay

## 2021-05-05 DIAGNOSIS — R109 Unspecified abdominal pain: Secondary | ICD-10-CM | POA: Insufficient documentation

## 2021-05-05 DIAGNOSIS — Y990 Civilian activity done for income or pay: Secondary | ICD-10-CM | POA: Diagnosis not present

## 2021-05-05 DIAGNOSIS — M545 Low back pain, unspecified: Secondary | ICD-10-CM | POA: Diagnosis not present

## 2021-05-05 DIAGNOSIS — R110 Nausea: Secondary | ICD-10-CM | POA: Diagnosis not present

## 2021-05-05 DIAGNOSIS — W228XXA Striking against or struck by other objects, initial encounter: Secondary | ICD-10-CM | POA: Diagnosis not present

## 2021-05-05 DIAGNOSIS — S0083XA Contusion of other part of head, initial encounter: Secondary | ICD-10-CM | POA: Diagnosis not present

## 2021-05-05 DIAGNOSIS — Z5321 Procedure and treatment not carried out due to patient leaving prior to being seen by health care provider: Secondary | ICD-10-CM | POA: Diagnosis not present

## 2021-05-05 DIAGNOSIS — S0990XA Unspecified injury of head, initial encounter: Secondary | ICD-10-CM | POA: Diagnosis present

## 2021-05-05 LAB — CBC
HCT: 41.6 % (ref 36.0–46.0)
Hemoglobin: 14 g/dL (ref 12.0–15.0)
MCH: 29.4 pg (ref 26.0–34.0)
MCHC: 33.7 g/dL (ref 30.0–36.0)
MCV: 87.2 fL (ref 80.0–100.0)
Platelets: 308 10*3/uL (ref 150–400)
RBC: 4.77 MIL/uL (ref 3.87–5.11)
RDW: 11.7 % (ref 11.5–15.5)
WBC: 6.9 10*3/uL (ref 4.0–10.5)
nRBC: 0 % (ref 0.0–0.2)

## 2021-05-05 LAB — BASIC METABOLIC PANEL
Anion gap: 9 (ref 5–15)
BUN: 15 mg/dL (ref 6–20)
CO2: 24 mmol/L (ref 22–32)
Calcium: 9.3 mg/dL (ref 8.9–10.3)
Chloride: 106 mmol/L (ref 98–111)
Creatinine, Ser: 0.79 mg/dL (ref 0.44–1.00)
GFR, Estimated: >60 mL/min (ref >60)
Glucose, Bld: 103 mg/dL — ABNORMAL HIGH (ref 70–99)
Potassium: 3.9 mmol/L (ref 3.5–5.1)
Sodium: 139 mmol/L (ref 135–145)

## 2021-05-05 LAB — URINALYSIS, ROUTINE W REFLEX MICROSCOPIC
Bilirubin Urine: NEGATIVE
Glucose, UA: NEGATIVE mg/dL
Hgb urine dipstick: NEGATIVE
Ketones, ur: NEGATIVE mg/dL
Leukocytes,Ua: NEGATIVE
Nitrite: NEGATIVE
Protein, ur: NEGATIVE mg/dL
Specific Gravity, Urine: 1.008 (ref 1.005–1.030)
pH: 6 (ref 5.0–8.0)

## 2021-05-05 LAB — CBG MONITORING, ED: Glucose-Capillary: 108 mg/dL — ABNORMAL HIGH (ref 70–99)

## 2021-05-05 LAB — I-STAT BETA HCG BLOOD, ED (MC, WL, AP ONLY): I-stat hCG, quantitative: <5 m[IU]/mL (ref <5)

## 2021-05-05 MED ORDER — ONDANSETRON 4 MG PO TBDP: 4.0000 mg | ORAL_TABLET | Freq: Once | ORAL | Status: DC

## 2021-05-05 NOTE — ED Provider Notes (Signed)
Emergency Medicine Provider Triage Evaluation Note  Kelsey Myers , a 25 y.o. female  was evaluated in triage.  Pt complains of chronic intermittent but approximately daily abdominal pain and back pain.  She states that she has had episodes when she was early in high school.  She states that she used to pass out on a weekly basis in high school because of how severe the pain will get.  She used to see a rheumatologist who diagnosed her of Takayasu arteritis and put her on steroids for a period of time however she states that she is no longer on steroids.  She states that she is at work today and had a flareup of pain that was severe enough that it made her become lightheaded.  She states she is uncertain whether she passed out or not but states that she did strike her head on the floor as she went down to the ground.  She states that she did feel that she caught herself that she went to the ground but ended up striking her head against the ground.  Overall she is uncertain whether she passed out or not.  She states she has headache and describes a circumferential achy severe 10/10 worse with light.  Denies any neck pain or neck stiffness.  He does endorse some nausea, low back pain and abdominal pain.  She states that seems to be in the epigastrium more than anywhere else.  Denies any other injuries from the fall.  No chest pain or shortness of breath.  No cough congestion fevers or chills.  States that she has not pooped in 1 week which is normal for her.  Denies any urinary frequency urgency or dysuria.  Review of Systems  Positive: Headache, abdominal pain, back pain Negative: Shortness of breath, chest pain  Physical Exam  BP 119/72 (BP Location: Right Arm)   Temp 98.9 F (37.2 C) (Oral)   Resp 16   Ht 5\' 1"  (1.549 m)   Wt 52.2 kg   LMP 04/03/2021   SpO2 99%   BMI 21.73 kg/m  Gen:   Appears uncomfortable, is holding her abdomen. Resp:  Normal effort  MSK:   Moves extremities without  difficulty  Other:  Abdomen is soft but tender in epigastrium.  No guarding or rebound.  On deep palpation there seems to be some generalized tenderness.  Again no rebound.  Medical Decision Making  Medically screening exam initiated at 6:26 PM.  Appropriate orders placed.  Kelsey Myers was informed that the remainder of the evaluation will be completed by another provider, this initial triage assessment does not replace that evaluation, and the importance of remaining in the ED until their evaluation is complete.   Patient with ongoing chronic pain that she attributes to Takayasu arteritis.  She has had a rheumatologist in the past however does not have one currently.  Has no PCP or other practitioner caring for pain or chronic conditions.  She states she has daily pain however seem to flareup today worse than usual which caused her to syncopized.  She is hazy on the details and is not certain that she did actually syncopized but did fall to the ground because of the pain.  She struck her head however she is speaking clearly does not seem to have any neurologic exam abnormalities on my brief exam.  She is complaining primarily of abdominal pain and back pain although she does endorse a headache.  Patient is stands that my evaluation was  a screening exam.  She understands the need to stay for entire work-up and is agreeable to this plan.  We will provide patient with 1 dose of Zofran given her nausea.  Will obtain i-STAT hCG to evaluate for pregnancy she is sexually active but states that she is not pregnant she is careful about this.  Doubt ruptured ectopic for this reason her vital signs are within normal limits she is neither hypotensive or tachycardic.  Will obtain CT abdomen pelvis without contrast as well as CT L-spine no charge to evaluate her low back pain as well as her abdominal pain.  Given her normal vital signs have relatively low suspicion for perforated viscus, AAA, ruptured ectopic  She  describes her pain as worsening of her chronic pain which makes ovarian torsion unlikely     Kelsey Myers, [place] 05/05/21 1833    Jacalyn Lefevre, MD 05/05/21 1859

## 2021-05-05 NOTE — ED Triage Notes (Signed)
Per EMS- Patient was at work. Patient reports intermittent episodes where she has lower back pain and abdominal pain which causes her to pass out.  Today, the patient c/o lower back pain, nausea, and abdominal pain and she went to the floor prior to passing out and hit her head on the floor. Patient has a hematoma to the forehead. Patient also c/o light sensitivity.

## 2021-05-06 NOTE — ED Notes (Signed)
PT NOT IN LOBBY AT TIME OF VITALS UPDATE
